# Patient Record
Sex: Female | Born: 1950 | Race: White | Hispanic: No | Marital: Married | Smoking: Former smoker
Health system: Southern US, Community
[De-identification: ages and names within clinical notes are randomized; demographics above are authoritative.]

## PROBLEM LIST (undated history)

## (undated) DIAGNOSIS — E876 Hypokalemia: Secondary | ICD-10-CM

## (undated) DIAGNOSIS — I890 Lymphedema, not elsewhere classified: Secondary | ICD-10-CM

## (undated) DIAGNOSIS — C50919 Malignant neoplasm of unspecified site of unspecified female breast: Secondary | ICD-10-CM

## (undated) HISTORY — DX: Lymphedema, not elsewhere classified: I89.0

## (undated) HISTORY — DX: Hypokalemia: E87.6

## (undated) HISTORY — DX: Malignant neoplasm of unspecified site of unspecified female breast: C50.919

## (undated) HISTORY — PX: BREAST BIOPSY: SHX20

---

## 2000-11-06 ENCOUNTER — Other Ambulatory Visit: Admission: RE | Admit: 2000-11-06 | Discharge: 2000-11-06 | Payer: Self-pay | Admitting: Obstetrics and Gynecology

## 2000-11-13 ENCOUNTER — Encounter: Payer: Self-pay | Admitting: Obstetrics and Gynecology

## 2000-11-13 ENCOUNTER — Encounter: Admission: RE | Admit: 2000-11-13 | Discharge: 2000-11-13 | Payer: Self-pay | Admitting: Obstetrics and Gynecology

## 2001-06-16 HISTORY — PX: BREAST LUMPECTOMY: SHX2

## 2001-09-15 ENCOUNTER — Encounter: Payer: Self-pay | Admitting: Obstetrics and Gynecology

## 2001-09-15 ENCOUNTER — Encounter: Admission: RE | Admit: 2001-09-15 | Discharge: 2001-09-15 | Payer: Self-pay | Admitting: Obstetrics and Gynecology

## 2001-10-18 ENCOUNTER — Other Ambulatory Visit: Admission: RE | Admit: 2001-10-18 | Discharge: 2001-10-18 | Payer: Self-pay | Admitting: Obstetrics and Gynecology

## 2001-11-04 ENCOUNTER — Encounter (INDEPENDENT_AMBULATORY_CARE_PROVIDER_SITE_OTHER): Payer: Self-pay | Admitting: *Deleted

## 2001-11-04 ENCOUNTER — Encounter: Payer: Self-pay | Admitting: Obstetrics and Gynecology

## 2001-11-04 ENCOUNTER — Encounter: Admission: RE | Admit: 2001-11-04 | Discharge: 2001-11-04 | Payer: Self-pay | Admitting: Obstetrics and Gynecology

## 2001-11-19 ENCOUNTER — Encounter: Payer: Self-pay | Admitting: General Surgery

## 2001-11-19 ENCOUNTER — Encounter: Admission: RE | Admit: 2001-11-19 | Discharge: 2001-11-19 | Payer: Self-pay | Admitting: General Surgery

## 2001-11-22 ENCOUNTER — Encounter: Admission: RE | Admit: 2001-11-22 | Discharge: 2001-11-22 | Payer: Self-pay | Admitting: General Surgery

## 2001-11-22 ENCOUNTER — Ambulatory Visit (HOSPITAL_BASED_OUTPATIENT_CLINIC_OR_DEPARTMENT_OTHER): Admission: RE | Admit: 2001-11-22 | Discharge: 2001-11-22 | Payer: Self-pay | Admitting: General Surgery

## 2001-11-22 ENCOUNTER — Encounter (INDEPENDENT_AMBULATORY_CARE_PROVIDER_SITE_OTHER): Payer: Self-pay | Admitting: *Deleted

## 2001-11-22 ENCOUNTER — Encounter: Payer: Self-pay | Admitting: General Surgery

## 2001-12-06 ENCOUNTER — Ambulatory Visit (HOSPITAL_BASED_OUTPATIENT_CLINIC_OR_DEPARTMENT_OTHER): Admission: RE | Admit: 2001-12-06 | Discharge: 2001-12-07 | Payer: Self-pay | Admitting: General Surgery

## 2001-12-06 ENCOUNTER — Encounter (INDEPENDENT_AMBULATORY_CARE_PROVIDER_SITE_OTHER): Payer: Self-pay | Admitting: *Deleted

## 2001-12-10 ENCOUNTER — Ambulatory Visit: Admission: RE | Admit: 2001-12-10 | Discharge: 2002-03-10 | Payer: Self-pay | Admitting: Radiation Oncology

## 2001-12-16 ENCOUNTER — Encounter: Admission: RE | Admit: 2001-12-16 | Discharge: 2001-12-16 | Payer: Self-pay | Admitting: Oncology

## 2001-12-16 ENCOUNTER — Encounter: Payer: Self-pay | Admitting: Oncology

## 2001-12-20 ENCOUNTER — Encounter: Payer: Self-pay | Admitting: Oncology

## 2001-12-20 ENCOUNTER — Ambulatory Visit (HOSPITAL_COMMUNITY): Admission: RE | Admit: 2001-12-20 | Discharge: 2001-12-20 | Payer: Self-pay | Admitting: Oncology

## 2002-02-07 ENCOUNTER — Other Ambulatory Visit: Admission: RE | Admit: 2002-02-07 | Discharge: 2002-02-07 | Payer: Self-pay | Admitting: Radiology

## 2002-04-21 ENCOUNTER — Encounter: Payer: Self-pay | Admitting: Oncology

## 2002-04-21 ENCOUNTER — Encounter: Admission: RE | Admit: 2002-04-21 | Discharge: 2002-04-21 | Payer: Self-pay | Admitting: Oncology

## 2002-04-25 ENCOUNTER — Ambulatory Visit: Admission: RE | Admit: 2002-04-25 | Discharge: 2002-05-03 | Payer: Self-pay | Admitting: Radiation Oncology

## 2002-05-02 ENCOUNTER — Encounter: Payer: Self-pay | Admitting: Oncology

## 2002-05-02 ENCOUNTER — Ambulatory Visit (HOSPITAL_COMMUNITY): Admission: RE | Admit: 2002-05-02 | Discharge: 2002-05-02 | Payer: Self-pay | Admitting: Oncology

## 2002-05-10 ENCOUNTER — Ambulatory Visit: Admission: RE | Admit: 2002-05-10 | Discharge: 2002-08-03 | Payer: Self-pay | Admitting: Radiation Oncology

## 2002-08-12 ENCOUNTER — Encounter: Payer: Self-pay | Admitting: Family Medicine

## 2002-08-12 ENCOUNTER — Encounter: Admission: RE | Admit: 2002-08-12 | Discharge: 2002-08-12 | Payer: Self-pay | Admitting: Family Medicine

## 2002-09-21 ENCOUNTER — Encounter: Admission: RE | Admit: 2002-09-21 | Discharge: 2002-09-21 | Payer: Self-pay | Admitting: General Surgery

## 2002-09-21 ENCOUNTER — Encounter: Payer: Self-pay | Admitting: General Surgery

## 2002-12-12 ENCOUNTER — Encounter: Payer: Self-pay | Admitting: Oncology

## 2002-12-12 ENCOUNTER — Encounter: Admission: RE | Admit: 2002-12-12 | Discharge: 2002-12-12 | Payer: Self-pay | Admitting: Oncology

## 2002-12-23 ENCOUNTER — Encounter: Payer: Self-pay | Admitting: Oncology

## 2002-12-23 ENCOUNTER — Ambulatory Visit (HOSPITAL_COMMUNITY): Admission: RE | Admit: 2002-12-23 | Discharge: 2002-12-23 | Payer: Self-pay | Admitting: Oncology

## 2003-01-24 ENCOUNTER — Other Ambulatory Visit: Admission: RE | Admit: 2003-01-24 | Discharge: 2003-01-24 | Payer: Self-pay | Admitting: *Deleted

## 2003-02-16 ENCOUNTER — Encounter: Payer: Self-pay | Admitting: Oncology

## 2003-02-16 ENCOUNTER — Encounter (HOSPITAL_COMMUNITY): Admission: RE | Admit: 2003-02-16 | Discharge: 2003-05-17 | Payer: Self-pay | Admitting: Oncology

## 2003-04-10 ENCOUNTER — Ambulatory Visit (HOSPITAL_COMMUNITY): Admission: RE | Admit: 2003-04-10 | Discharge: 2003-04-10 | Payer: Self-pay | Admitting: Gastroenterology

## 2003-06-17 HISTORY — PX: BREAST REDUCTION SURGERY: SHX8

## 2003-12-21 ENCOUNTER — Encounter: Admission: RE | Admit: 2003-12-21 | Discharge: 2003-12-21 | Payer: Self-pay | Admitting: Oncology

## 2004-03-12 ENCOUNTER — Encounter (INDEPENDENT_AMBULATORY_CARE_PROVIDER_SITE_OTHER): Payer: Self-pay | Admitting: Specialist

## 2004-03-12 ENCOUNTER — Ambulatory Visit (HOSPITAL_COMMUNITY): Admission: RE | Admit: 2004-03-12 | Discharge: 2004-03-12 | Payer: Self-pay | Admitting: Plastic Surgery

## 2004-03-12 ENCOUNTER — Ambulatory Visit (HOSPITAL_BASED_OUTPATIENT_CLINIC_OR_DEPARTMENT_OTHER): Admission: RE | Admit: 2004-03-12 | Discharge: 2004-03-12 | Payer: Self-pay | Admitting: Plastic Surgery

## 2004-06-12 ENCOUNTER — Encounter: Admission: RE | Admit: 2004-06-12 | Discharge: 2004-06-12 | Payer: Self-pay | Admitting: Oncology

## 2004-08-05 ENCOUNTER — Encounter: Admission: RE | Admit: 2004-08-05 | Discharge: 2004-08-05 | Payer: Self-pay | Admitting: Family Medicine

## 2004-08-30 ENCOUNTER — Ambulatory Visit: Payer: Self-pay | Admitting: Oncology

## 2004-09-23 ENCOUNTER — Encounter: Admission: RE | Admit: 2004-09-23 | Discharge: 2004-09-23 | Payer: Self-pay | Admitting: Oncology

## 2005-02-26 ENCOUNTER — Encounter: Admission: RE | Admit: 2005-02-26 | Discharge: 2005-02-26 | Payer: Self-pay | Admitting: Oncology

## 2005-03-04 ENCOUNTER — Ambulatory Visit: Payer: Self-pay | Admitting: Oncology

## 2005-06-23 ENCOUNTER — Encounter: Admission: RE | Admit: 2005-06-23 | Discharge: 2005-06-23 | Payer: Self-pay | Admitting: General Surgery

## 2005-06-25 ENCOUNTER — Encounter: Admission: RE | Admit: 2005-06-25 | Discharge: 2005-06-25 | Payer: Self-pay | Admitting: Oncology

## 2005-08-27 ENCOUNTER — Ambulatory Visit: Payer: Self-pay | Admitting: Oncology

## 2006-01-02 ENCOUNTER — Ambulatory Visit: Payer: Self-pay | Admitting: Oncology

## 2006-01-07 LAB — COMPREHENSIVE METABOLIC PANEL
ALT: 26 U/L (ref 0–40)
CO2: 28 mEq/L (ref 19–32)
Calcium: 9.4 mg/dL (ref 8.4–10.5)
Chloride: 102 mEq/L (ref 96–112)
Creatinine, Ser: 0.73 mg/dL (ref 0.40–1.20)
Glucose, Bld: 152 mg/dL — ABNORMAL HIGH (ref 70–99)
Total Protein: 6.7 g/dL (ref 6.0–8.3)

## 2006-01-07 LAB — CBC WITH DIFFERENTIAL/PLATELET
BASO%: 0.3 % (ref 0.0–2.0)
Basophils Absolute: 0 10*3/uL (ref 0.0–0.1)
Eosinophils Absolute: 0.2 10*3/uL (ref 0.0–0.5)
HCT: 43 % (ref 34.8–46.6)
HGB: 14.5 g/dL (ref 11.6–15.9)
MCHC: 33.7 g/dL (ref 32.0–36.0)
MONO#: 0.4 10*3/uL (ref 0.1–0.9)
NEUT#: 4.5 10*3/uL (ref 1.5–6.5)
NEUT%: 67.2 % (ref 39.6–76.8)
WBC: 6.7 10*3/uL (ref 3.9–10.0)
lymph#: 1.6 10*3/uL (ref 0.9–3.3)

## 2006-01-07 LAB — CANCER ANTIGEN 27.29: CA 27.29: 21 U/mL (ref 0–39)

## 2006-05-03 ENCOUNTER — Ambulatory Visit: Payer: Self-pay | Admitting: Oncology

## 2006-05-06 LAB — CBC WITH DIFFERENTIAL/PLATELET
BASO%: 1.3 % (ref 0.0–2.0)
EOS%: 2.6 % (ref 0.0–7.0)
HCT: 43.3 % (ref 34.8–46.6)
MCH: 28.5 pg (ref 26.0–34.0)
MCHC: 34.3 g/dL (ref 32.0–36.0)
MONO#: 0.4 10*3/uL (ref 0.1–0.9)
NEUT%: 67.3 % (ref 39.6–76.8)
RBC: 5.21 10*6/uL (ref 3.70–5.32)
RDW: 11.3 % (ref 11.3–14.5)
WBC: 6.9 10*3/uL (ref 3.9–10.0)
lymph#: 1.6 10*3/uL (ref 0.9–3.3)

## 2006-05-06 LAB — COMPREHENSIVE METABOLIC PANEL
ALT: 23 U/L (ref 0–35)
AST: 18 U/L (ref 0–37)
Albumin: 4.8 g/dL (ref 3.5–5.2)
Alkaline Phosphatase: 64 U/L (ref 39–117)
BUN: 11 mg/dL (ref 6–23)
Chloride: 96 mEq/L (ref 96–112)
Creatinine, Ser: 0.63 mg/dL (ref 0.40–1.20)
Potassium: 2.8 mEq/L — ABNORMAL LOW (ref 3.5–5.3)

## 2006-05-06 LAB — CANCER ANTIGEN 27.29: CA 27.29: 17 U/mL (ref 0–39)

## 2006-05-12 ENCOUNTER — Ambulatory Visit (HOSPITAL_COMMUNITY): Admission: RE | Admit: 2006-05-12 | Discharge: 2006-05-12 | Payer: Self-pay | Admitting: Oncology

## 2006-05-15 LAB — COMPREHENSIVE METABOLIC PANEL
Albumin: 4 g/dL (ref 3.5–5.2)
BUN: 12 mg/dL (ref 6–23)
Calcium: 9.9 mg/dL (ref 8.4–10.5)
Chloride: 103 mEq/L (ref 96–112)
Creatinine, Ser: 0.6 mg/dL (ref 0.40–1.20)
Glucose, Bld: 102 mg/dL — ABNORMAL HIGH (ref 70–99)
Potassium: 3.5 mEq/L (ref 3.5–5.3)

## 2006-06-16 HISTORY — PX: FOOT FRACTURE SURGERY: SHX645

## 2006-06-24 ENCOUNTER — Encounter: Admission: RE | Admit: 2006-06-24 | Discharge: 2006-06-24 | Payer: Self-pay | Admitting: Oncology

## 2006-06-26 ENCOUNTER — Other Ambulatory Visit: Admission: RE | Admit: 2006-06-26 | Discharge: 2006-06-26 | Payer: Self-pay | Admitting: Family Medicine

## 2006-11-03 ENCOUNTER — Ambulatory Visit: Payer: Self-pay | Admitting: Oncology

## 2006-11-05 ENCOUNTER — Ambulatory Visit (HOSPITAL_COMMUNITY): Admission: RE | Admit: 2006-11-05 | Discharge: 2006-11-05 | Payer: Self-pay | Admitting: Oncology

## 2006-11-05 LAB — COMPREHENSIVE METABOLIC PANEL
ALT: 20 U/L (ref 0–35)
AST: 20 U/L (ref 0–37)
Albumin: 4.6 g/dL (ref 3.5–5.2)
CO2: 29 mEq/L (ref 19–32)
Calcium: 9.9 mg/dL (ref 8.4–10.5)
Chloride: 102 mEq/L (ref 96–112)
Creatinine, Ser: 0.66 mg/dL (ref 0.40–1.20)
Potassium: 3.4 mEq/L — ABNORMAL LOW (ref 3.5–5.3)
Sodium: 143 mEq/L (ref 135–145)
Total Protein: 6.8 g/dL (ref 6.0–8.3)

## 2006-11-05 LAB — CBC WITH DIFFERENTIAL/PLATELET
EOS%: 3.4 % (ref 0.0–7.0)
MCH: 28.8 pg (ref 26.0–34.0)
MCV: 82 fL (ref 81.0–101.0)
MONO%: 7.4 % (ref 0.0–13.0)
NEUT#: 4 10*3/uL (ref 1.5–6.5)
RBC: 5.03 10*6/uL (ref 3.70–5.32)
RDW: 13 % (ref 11.3–14.5)

## 2006-11-05 LAB — LACTATE DEHYDROGENASE: LDH: 128 U/L (ref 94–250)

## 2007-05-11 ENCOUNTER — Ambulatory Visit (HOSPITAL_BASED_OUTPATIENT_CLINIC_OR_DEPARTMENT_OTHER): Admission: RE | Admit: 2007-05-11 | Discharge: 2007-05-11 | Payer: Self-pay | Admitting: Orthopedic Surgery

## 2007-05-18 ENCOUNTER — Ambulatory Visit: Payer: Self-pay | Admitting: Oncology

## 2007-05-21 LAB — COMPREHENSIVE METABOLIC PANEL
ALT: 17 U/L (ref 0–35)
AST: 14 U/L (ref 0–37)
Albumin: 4.6 g/dL (ref 3.5–5.2)
Alkaline Phosphatase: 58 U/L (ref 39–117)
BUN: 13 mg/dL (ref 6–23)
CO2: 27 mEq/L (ref 19–32)
Calcium: 9.9 mg/dL (ref 8.4–10.5)
Chloride: 105 mEq/L (ref 96–112)
Creatinine, Ser: 0.64 mg/dL (ref 0.40–1.20)
Glucose, Bld: 91 mg/dL (ref 70–99)
Potassium: 3.9 mEq/L (ref 3.5–5.3)
Sodium: 142 mEq/L (ref 135–145)
Total Bilirubin: 0.5 mg/dL (ref 0.3–1.2)
Total Protein: 7.1 g/dL (ref 6.0–8.3)

## 2007-05-21 LAB — CBC WITH DIFFERENTIAL/PLATELET
EOS%: 2.2 % (ref 0.0–7.0)
Eosinophils Absolute: 0.2 10*3/uL (ref 0.0–0.5)
LYMPH%: 27.2 % (ref 14.0–48.0)
MCH: 28.7 pg (ref 26.0–34.0)
MCHC: 34.6 g/dL (ref 32.0–36.0)
MCV: 82.9 fL (ref 81.0–101.0)
MONO%: 7.4 % (ref 0.0–13.0)
NEUT#: 4.3 10*3/uL (ref 1.5–6.5)
Platelets: 213 10*3/uL (ref 145–400)
RBC: 5.03 10*6/uL (ref 3.70–5.32)
RDW: 12.8 % (ref 11.3–14.5)

## 2007-05-21 LAB — CANCER ANTIGEN 27.29: CA 27.29: 20 U/mL (ref 0–39)

## 2007-05-21 LAB — LACTATE DEHYDROGENASE: LDH: 140 U/L (ref 94–250)

## 2007-06-28 ENCOUNTER — Encounter: Admission: RE | Admit: 2007-06-28 | Discharge: 2007-06-28 | Payer: Self-pay | Admitting: Oncology

## 2007-07-12 ENCOUNTER — Encounter: Admission: RE | Admit: 2007-07-12 | Discharge: 2007-07-12 | Payer: Self-pay | Admitting: Oncology

## 2007-08-12 ENCOUNTER — Ambulatory Visit: Payer: Self-pay | Admitting: Oncology

## 2007-08-16 LAB — CBC WITH DIFFERENTIAL/PLATELET
Basophils Absolute: 0.1 10*3/uL (ref 0.0–0.1)
EOS%: 1.5 % (ref 0.0–7.0)
HGB: 14.2 g/dL (ref 11.6–15.9)
MCH: 28.2 pg (ref 26.0–34.0)
NEUT#: 4.2 10*3/uL (ref 1.5–6.5)
RDW: 12.8 % (ref 11.3–14.5)
lymph#: 1.8 10*3/uL (ref 0.9–3.3)

## 2007-08-16 LAB — COMPREHENSIVE METABOLIC PANEL
ALT: 19 U/L (ref 0–35)
AST: 16 U/L (ref 0–37)
Albumin: 4.4 g/dL (ref 3.5–5.2)
BUN: 11 mg/dL (ref 6–23)
Calcium: 9.6 mg/dL (ref 8.4–10.5)
Chloride: 101 mEq/L (ref 96–112)
Potassium: 3.2 mEq/L — ABNORMAL LOW (ref 3.5–5.3)
Total Protein: 6.9 g/dL (ref 6.0–8.3)

## 2007-08-16 LAB — CANCER ANTIGEN 27.29: CA 27.29: 23 U/mL (ref 0–39)

## 2008-06-29 ENCOUNTER — Encounter: Admission: RE | Admit: 2008-06-29 | Discharge: 2008-06-29 | Payer: Self-pay | Admitting: Oncology

## 2008-08-16 ENCOUNTER — Ambulatory Visit: Payer: Self-pay | Admitting: Oncology

## 2008-08-18 LAB — CBC WITH DIFFERENTIAL/PLATELET
BASO%: 0.4 % (ref 0.0–2.0)
MCHC: 33.6 g/dL (ref 31.5–36.0)
MONO#: 0.5 10*3/uL (ref 0.1–0.9)
NEUT#: 2.9 10*3/uL (ref 1.5–6.5)
RBC: 5.24 10*6/uL (ref 3.70–5.45)
WBC: 4.9 10*3/uL (ref 3.9–10.3)
lymph#: 1.4 10*3/uL (ref 0.9–3.3)

## 2008-08-21 LAB — COMPREHENSIVE METABOLIC PANEL
ALT: 17 U/L (ref 0–35)
Albumin: 4.3 g/dL (ref 3.5–5.2)
CO2: 31 mEq/L (ref 19–32)
Calcium: 9.8 mg/dL (ref 8.4–10.5)
Chloride: 97 mEq/L (ref 96–112)
Potassium: 3.6 mEq/L (ref 3.5–5.3)
Sodium: 138 mEq/L (ref 135–145)
Total Protein: 6.9 g/dL (ref 6.0–8.3)

## 2008-08-21 LAB — LACTATE DEHYDROGENASE: LDH: 122 U/L (ref 94–250)

## 2008-08-21 LAB — LIPID PANEL
LDL Cholesterol: 139 mg/dL — ABNORMAL HIGH (ref 0–99)
Triglycerides: 106 mg/dL (ref <150)
VLDL: 21 mg/dL (ref 0–40)

## 2009-06-16 HISTORY — PX: HAND SURGERY: SHX662

## 2009-07-02 ENCOUNTER — Encounter: Admission: RE | Admit: 2009-07-02 | Discharge: 2009-07-02 | Payer: Self-pay | Admitting: Oncology

## 2009-07-12 ENCOUNTER — Encounter: Admission: RE | Admit: 2009-07-12 | Discharge: 2009-07-12 | Payer: Self-pay | Admitting: Oncology

## 2009-07-13 ENCOUNTER — Encounter: Admission: RE | Admit: 2009-07-13 | Discharge: 2009-07-13 | Payer: Self-pay | Admitting: Oncology

## 2009-07-19 ENCOUNTER — Encounter: Admission: RE | Admit: 2009-07-19 | Discharge: 2009-07-19 | Payer: Self-pay | Admitting: Oncology

## 2009-08-27 ENCOUNTER — Encounter: Admission: RE | Admit: 2009-08-27 | Discharge: 2009-08-27 | Payer: Self-pay | Admitting: Surgery

## 2009-08-27 ENCOUNTER — Ambulatory Visit (HOSPITAL_BASED_OUTPATIENT_CLINIC_OR_DEPARTMENT_OTHER): Admission: RE | Admit: 2009-08-27 | Discharge: 2009-08-27 | Payer: Self-pay | Admitting: Surgery

## 2009-09-07 ENCOUNTER — Ambulatory Visit (HOSPITAL_BASED_OUTPATIENT_CLINIC_OR_DEPARTMENT_OTHER): Payer: Managed Care, Other (non HMO) | Admitting: Oncology

## 2009-09-11 LAB — LIPID PANEL
Cholesterol: 199 mg/dL (ref 0–200)
HDL: 43 mg/dL (ref >39)
Total CHOL/HDL Ratio: 4.6 Ratio
Triglycerides: 179 mg/dL — ABNORMAL HIGH (ref <150)

## 2009-09-11 LAB — COMPREHENSIVE METABOLIC PANEL
ALT: 17 U/L (ref 0–35)
BUN: 11 mg/dL (ref 6–23)
CO2: 26 mEq/L (ref 19–32)
Calcium: 9.5 mg/dL (ref 8.4–10.5)
Chloride: 99 mEq/L (ref 96–112)
Creatinine, Ser: 0.69 mg/dL (ref 0.40–1.20)
Total Bilirubin: 0.4 mg/dL (ref 0.3–1.2)

## 2009-09-11 LAB — CBC WITH DIFFERENTIAL/PLATELET
BASO%: 0.5 % (ref 0.0–2.0)
Basophils Absolute: 0 10*3/uL (ref 0.0–0.1)
HCT: 40.3 % (ref 34.8–46.6)
HGB: 13.7 g/dL (ref 11.6–15.9)
LYMPH%: 25.1 % (ref 14.0–49.7)
MCH: 28.7 pg (ref 25.1–34.0)
MCHC: 34 g/dL (ref 31.5–36.0)
MONO#: 0.5 10*3/uL (ref 0.1–0.9)
NEUT%: 63.7 % (ref 38.4–76.8)
Platelets: 222 10*3/uL (ref 145–400)
WBC: 7.5 10*3/uL (ref 3.9–10.3)
lymph#: 1.9 10*3/uL (ref 0.9–3.3)

## 2009-09-11 LAB — CANCER ANTIGEN 27.29: CA 27.29: 18 U/mL (ref 0–39)

## 2009-09-19 LAB — LIPID PANEL
HDL: 51 mg/dL (ref >39)
LDL Cholesterol: 131 mg/dL — ABNORMAL HIGH (ref 0–99)
Triglycerides: 135 mg/dL (ref <150)

## 2010-07-04 ENCOUNTER — Encounter
Admission: RE | Admit: 2010-07-04 | Discharge: 2010-07-04 | Payer: Self-pay | Source: Home / Self Care | Attending: Oncology | Admitting: Oncology

## 2010-07-07 ENCOUNTER — Encounter: Payer: Self-pay | Admitting: Oncology

## 2010-07-08 ENCOUNTER — Encounter: Payer: Self-pay | Admitting: Oncology

## 2010-09-06 LAB — BASIC METABOLIC PANEL
BUN: 12 mg/dL (ref 6–23)
CO2: 30 mEq/L (ref 19–32)
Chloride: 99 mEq/L (ref 96–112)
Creatinine, Ser: 0.66 mg/dL (ref 0.4–1.2)
Glucose, Bld: 101 mg/dL — ABNORMAL HIGH (ref 70–99)
Potassium: 3.5 mEq/L (ref 3.5–5.1)

## 2010-09-06 LAB — POCT HEMOGLOBIN-HEMACUE: Hemoglobin: 15.2 g/dL — ABNORMAL HIGH (ref 12.0–15.0)

## 2010-09-20 ENCOUNTER — Encounter: Payer: Managed Care, Other (non HMO) | Admitting: Oncology

## 2010-09-20 DIAGNOSIS — C50519 Malignant neoplasm of lower-outer quadrant of unspecified female breast: Secondary | ICD-10-CM

## 2010-09-20 LAB — CBC WITH DIFFERENTIAL/PLATELET
Basophils Absolute: 0 10*3/uL (ref 0.0–0.1)
EOS%: 1.7 % (ref 0.0–7.0)
HCT: 41.3 % (ref 34.8–46.6)
HGB: 14 g/dL (ref 11.6–15.9)
LYMPH%: 28.4 % (ref 14.0–49.7)
MCH: 28.4 pg (ref 25.1–34.0)
MCHC: 33.9 g/dL (ref 31.5–36.0)
MCV: 83.8 fL (ref 79.5–101.0)
NEUT%: 62.5 % (ref 38.4–76.8)
Platelets: 172 10*3/uL (ref 145–400)
lymph#: 1.9 10*3/uL (ref 0.9–3.3)

## 2010-09-20 LAB — COMPREHENSIVE METABOLIC PANEL
AST: 13 U/L (ref 0–37)
BUN: 19 mg/dL (ref 6–23)
Calcium: 9.9 mg/dL (ref 8.4–10.5)
Chloride: 100 mEq/L (ref 96–112)
Creatinine, Ser: 0.81 mg/dL (ref 0.40–1.20)
Total Bilirubin: 0.4 mg/dL (ref 0.3–1.2)

## 2010-09-20 LAB — LACTATE DEHYDROGENASE: LDH: 125 U/L (ref 94–250)

## 2010-09-20 LAB — VITAMIN D 25 HYDROXY (VIT D DEFICIENCY, FRACTURES): Vit D, 25-Hydroxy: 107 ng/mL — ABNORMAL HIGH (ref 30–89)

## 2010-10-03 ENCOUNTER — Encounter (HOSPITAL_BASED_OUTPATIENT_CLINIC_OR_DEPARTMENT_OTHER): Payer: Managed Care, Other (non HMO) | Admitting: Oncology

## 2010-10-07 ENCOUNTER — Other Ambulatory Visit (HOSPITAL_COMMUNITY): Payer: Self-pay | Admitting: Ophthalmology

## 2010-10-07 ENCOUNTER — Other Ambulatory Visit: Payer: Self-pay | Admitting: Oncology

## 2010-10-07 DIAGNOSIS — C50919 Malignant neoplasm of unspecified site of unspecified female breast: Secondary | ICD-10-CM

## 2010-10-07 DIAGNOSIS — Z1231 Encounter for screening mammogram for malignant neoplasm of breast: Secondary | ICD-10-CM

## 2010-10-07 DIAGNOSIS — Z780 Asymptomatic menopausal state: Secondary | ICD-10-CM

## 2010-10-29 NOTE — Op Note (Signed)
NAMEJON, KASPAREK NO.:  [id number]   MEDICAL RECORD NO.:  [id number]          PATIENT TYPE:  AMB   LOCATION:  DSC                          FACILITY:  [facility]   PHYSICIAN:  Leonides Grills, M.D.     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   OPERATION:  1. Open reduction internal fixation left second toe PIP joint.  2. Left second toe proximal phalanx head resection.   ANESTHESIA:  Is general.   SURGEON:  Leonides Grills, M.D.   ASSISTANT:  Evlyn Kanner, P.A.-C.   ESTIMATED BLOOD LOSS:  Minimal.   TOURNIQUET TIME:  Approximately a half hour.   COMPLICATIONS:  None.   DISPOSITION:  Stable to PR.   INDICATIONS:  This is a 60 year old female who sustained the above  injury.  She was consented for the above procedure.  All risks which  include infection, nerve or vessel injury, toe deformity, redislocation,  hardware irritation, hardware failure and loss of motion through the PIP  joint and a stiff toe were all explained.  Questions were encouraged and  answered.   OPERATIVE NOTE:  The patient was taken to the operating room and placed  in supine position.  After adequate general anesthesia was administered  as well as Ancef 1 gram IV piggyback, left lower extremity was then  prepped and draped sterile manner.  With a proximally thigh tourniquet,  limb was gravity exsanguinated, and tourniquet was inflated to 290 mmHg.  A longitudinal incision over the dorsal aspect of the left second toe  was then made.  Dissection was carried down through skin.  Hemostasis  was obtained.  Extensor tendon was identified, mobilized, and retracted  of harm's way.  The PIP joint was then entered.   There was a large  amount of callus and scar tissue in this area.  This was carefully  dissected out.  The proximal phalanx head was then identified, and due  to the fact this was so contracted and the head was difficult to place  back in due to soft tissue tension, we performed a proximal phalanx head  resection.  This allowed the joint to reduce beautifully.  We then  placed a 0.0045 K-wire antegrade through the middle and distal phalanx,  reduced PIP joint, fired this retrograde into the proximal phalanx.  We  did not cross the MTP joint.  We then obtained stress x-rays in the AP  and lateral planes which showed no gross motion, fixation, proposition  and excellent alignment as well in the AP lateral planes.  Tourniquet  was deflated.  Hemostasis  was obtained.  Toe pinked up nicely.  Skin relieving incisions were made  on either side of the K-wire.  K-wire was bent, cut and capped.  Skin  was closed with 4-0 nylon stitch.  Sterile dressing was applied.  Hard  sole shoe was applied.  Patient was stable to PR.      Leonides Grills, M.D.  Electronically Signed     PB/MEDQ  D:  05/11/2007  T:  05/11/2007  Job:  [id number]

## 2010-10-30 ENCOUNTER — Inpatient Hospital Stay: Admission: RE | Admit: 2010-10-30 | Payer: Managed Care, Other (non HMO) | Source: Ambulatory Visit

## 2011-03-25 LAB — BASIC METABOLIC PANEL
CO2: 35 — ABNORMAL HIGH
Chloride: 94 — ABNORMAL LOW
GFR calc Af Amer: >60
Potassium: 3 — ABNORMAL LOW
Sodium: 135

## 2011-03-25 LAB — POCT HEMOGLOBIN-HEMACUE
Hemoglobin: 14.6
Operator id: 128471

## 2011-03-26 ENCOUNTER — Encounter (HOSPITAL_BASED_OUTPATIENT_CLINIC_OR_DEPARTMENT_OTHER)
Admission: RE | Admit: 2011-03-26 | Discharge: 2011-03-26 | Disposition: A | Discharge status: Home / Self Care | Payer: Managed Care, Other (non HMO) | Source: Ambulatory Visit | Attending: Orthopedic Surgery | Admitting: Orthopedic Surgery

## 2011-03-26 LAB — BASIC METABOLIC PANEL
CO2: 30 mEq/L (ref 19–32)
Chloride: 101 mEq/L (ref 96–112)
GFR calc Af Amer: >90 mL/min (ref >90)
Potassium: 3.5 mEq/L (ref 3.5–5.1)
Sodium: 140 mEq/L (ref 135–145)

## 2011-03-28 ENCOUNTER — Other Ambulatory Visit: Payer: Self-pay | Admitting: Orthopedic Surgery

## 2011-03-28 ENCOUNTER — Ambulatory Visit (HOSPITAL_BASED_OUTPATIENT_CLINIC_OR_DEPARTMENT_OTHER)
Admission: RE | Admit: 2011-03-28 | Discharge: 2011-03-28 | Disposition: A | Discharge status: Home / Self Care | Payer: Managed Care, Other (non HMO) | Source: Ambulatory Visit | Attending: Orthopedic Surgery | Admitting: Orthopedic Surgery

## 2011-03-28 DIAGNOSIS — R229 Localized swelling, mass and lump, unspecified: Secondary | ICD-10-CM | POA: Insufficient documentation

## 2011-03-28 DIAGNOSIS — F17200 Nicotine dependence, unspecified, uncomplicated: Secondary | ICD-10-CM | POA: Insufficient documentation

## 2011-03-28 DIAGNOSIS — Z01810 Encounter for preprocedural cardiovascular examination: Secondary | ICD-10-CM | POA: Insufficient documentation

## 2011-03-28 DIAGNOSIS — Z01812 Encounter for preprocedural laboratory examination: Secondary | ICD-10-CM | POA: Insufficient documentation

## 2011-03-28 DIAGNOSIS — I10 Essential (primary) hypertension: Secondary | ICD-10-CM | POA: Insufficient documentation

## 2011-03-28 LAB — POCT HEMOGLOBIN-HEMACUE: Hemoglobin: 14.6 g/dL (ref 12.0–15.0)

## 2011-04-02 NOTE — Op Note (Signed)
  Suzanne Carney, Suzanne Carney NO.:  [id number]  MEDICAL RECORD NO.:  [id number]  LOCATION:                                 FACILITY:  PHYSICIAN:  Katy Fitch. Savahanna Almendariz, M.D. DATE OF BIRTH:  12-Feb-1951  DATE OF PROCEDURE:  03/28/2011 DATE OF DISCHARGE:                              OPERATIVE REPORT   PREOPERATIVE DIAGNOSIS:  Enlarging firm mass, palmar aspect of right ring finger middle phalangeal segment.  POSTOPERATIVE DIAGNOSIS:  Firm, white-gray nodular mass, rule out granuloma annulare, rule out giant cell tumor.  Final diagnosis pending histopathologic evaluation.  OPERATION:  Resection of flexor sheath mass, right ring finger middle phalangeal segment.  OPERATING SURGEON:  Katy Fitch. Alline Pio, MD  ASSISTANT:  Marveen Reeks Dasnoit, PA-C  ANESTHESIA:  General by LMA supplemented by a postoperative 2% lidocaine digital block for postoperative comfort.  SUPERVISING ANESTHESIOLOGIST:  Janetta Hora. Gelene Mink, MD  INDICATIONS:  Suzanne Carney is a 60 year old woman referred through the courtesy of Dr. Foy Guadalajara of [facility] with a mass on the volar aspect of her right ring finger.  Clinical examination revealed a firm mass consistent with giant cell tumor, granuloma annulare, or other nerve element tumor.  This had been enlarging.  She requested excisional biopsy.  Preoperatively, questions were invited and answered in detail.  PROCEDURE:  [name] was brought to room 1 of the [facility] and placed in supine position on the operating table.  Due to IV access in the right antecubital fossa, a proximal forearm tourniquet was placed on the right.  The right arm was then prepped with Betadine soap and solution, and sterilely draped.  Following a routine surgical time- out, the right arm was exsanguinated with an Esmarch bandage and the proximal forearm tourniquet inflated to 220 mmHg.  Procedure commenced with planning of a Brunner zigzag incision  directly over the mass.  Subcutaneous tissues were carefully divided revealing a whitish-gray, firm, nodular mass that was adherent to the dermis, the flexor sheath, and surrounding the neurovascular bundles.  The neurovascular bundles were meticulously dissected, taking care to gently dissect the mass off the proper digital nerves both radial and ulnar. This was adherent to the A4 pulley and the C3 pulley.  After careful marginal resection, the mass was placed in formalin for pathologic evaluation.  This was a marginal resection for biopsy and if this lesion is benign should have a high probability of improvement.  The final diagnosis is uncertain pending pathologic evaluation.  The wound was inspected for bleeding points followed by repair of the skin with interrupted suture of 5-0 nylon.  For aftercare, [name] was placed in a compressive dressing with Xeroflo, sterile gauze, and Coban.  We will see her back in followup in a week for dressing change, suture removal, and discussion of her pathologic results.     Katy Fitch Sherry Rogus, M.D.     RVS/MEDQ  D:  03/28/2011  T:  03/28/2011  Job:  [id number]  cc:   Molly Maduro L. Foy Guadalajara, M.D.  Electronically Signed by Josephine Igo M.D. on 04/02/2011 07:55:58 AM

## 2011-04-10 ENCOUNTER — Encounter: Payer: Self-pay | Admitting: *Deleted

## 2011-04-10 DIAGNOSIS — C50912 Malignant neoplasm of unspecified site of left female breast: Secondary | ICD-10-CM | POA: Insufficient documentation

## 2011-04-10 DIAGNOSIS — C50919 Malignant neoplasm of unspecified site of unspecified female breast: Secondary | ICD-10-CM

## 2011-04-23 ENCOUNTER — Other Ambulatory Visit: Payer: Self-pay | Admitting: Oncology

## 2011-04-23 DIAGNOSIS — Z853 Personal history of malignant neoplasm of breast: Secondary | ICD-10-CM

## 2011-04-23 DIAGNOSIS — Z9889 Other specified postprocedural states: Secondary | ICD-10-CM

## 2011-05-26 ENCOUNTER — Telehealth: Payer: Self-pay | Admitting: *Deleted

## 2011-05-26 ENCOUNTER — Ambulatory Visit: Payer: Managed Care, Other (non HMO) | Admitting: Infectious Diseases

## 2011-05-26 NOTE — Telephone Encounter (Signed)
gave patient appointment for 09-24-2011 starting at 10:00am gave patient appointment for mammogram and bone density on 07-07-2011 at 11:00am

## 2011-05-27 ENCOUNTER — Encounter: Payer: Managed Care, Other (non HMO) | Admitting: Infectious Diseases

## 2011-05-27 ENCOUNTER — Encounter: Payer: Self-pay | Admitting: Infectious Diseases

## 2011-07-07 ENCOUNTER — Ambulatory Visit
Admission: RE | Admit: 2011-07-07 | Discharge: 2011-07-07 | Disposition: A | Discharge status: Home / Self Care | Payer: Managed Care, Other (non HMO) | Source: Ambulatory Visit | Attending: Oncology | Admitting: Oncology

## 2011-07-07 DIAGNOSIS — Z780 Asymptomatic menopausal state: Secondary | ICD-10-CM

## 2011-07-07 DIAGNOSIS — Z1231 Encounter for screening mammogram for malignant neoplasm of breast: Secondary | ICD-10-CM

## 2011-08-13 ENCOUNTER — Encounter: Payer: Self-pay | Admitting: Oncology

## 2011-08-13 NOTE — Progress Notes (Signed)
08/13/2011  Received a denial letter from [facility] for a Breast MRI scheduled for 08/14/2011 @ [facility].  Do you want to do a peer-to-peer or for me to call Wilkie Aye and have this MRI rescheduled?  [phone] option 4  [employer]  [id number]

## 2011-08-15 ENCOUNTER — Other Ambulatory Visit: Payer: Managed Care, Other (non HMO)

## 2011-09-17 ENCOUNTER — Telehealth: Payer: Self-pay | Admitting: *Deleted

## 2011-09-17 ENCOUNTER — Other Ambulatory Visit: Payer: Self-pay | Admitting: *Deleted

## 2011-09-17 NOTE — Telephone Encounter (Signed)
left voice message to inform the patient of the new date and time on 09-22-2011 starting at 10:00am

## 2011-09-18 ENCOUNTER — Other Ambulatory Visit: Payer: Self-pay | Admitting: Oncology

## 2011-09-18 DIAGNOSIS — C50919 Malignant neoplasm of unspecified site of unspecified female breast: Secondary | ICD-10-CM

## 2011-09-22 ENCOUNTER — Telehealth: Payer: Self-pay | Admitting: *Deleted

## 2011-09-22 ENCOUNTER — Ambulatory Visit (HOSPITAL_BASED_OUTPATIENT_CLINIC_OR_DEPARTMENT_OTHER): Payer: Managed Care, Other (non HMO) | Admitting: Oncology

## 2011-09-22 ENCOUNTER — Other Ambulatory Visit (HOSPITAL_BASED_OUTPATIENT_CLINIC_OR_DEPARTMENT_OTHER): Payer: Managed Care, Other (non HMO) | Admitting: Lab

## 2011-09-22 ENCOUNTER — Encounter: Payer: Self-pay | Admitting: *Deleted

## 2011-09-22 VITALS — BP 116/78 | HR 86 | Temp 98.3°F | Ht <= 58 in | Wt 137.4 lb

## 2011-09-22 DIAGNOSIS — Z853 Personal history of malignant neoplasm of breast: Secondary | ICD-10-CM

## 2011-09-22 DIAGNOSIS — C50919 Malignant neoplasm of unspecified site of unspecified female breast: Secondary | ICD-10-CM

## 2011-09-22 LAB — CBC WITH DIFFERENTIAL/PLATELET
BASO%: 0.9 % (ref 0.0–2.0)
EOS%: 3.1 % (ref 0.0–7.0)
HCT: 38.9 % (ref 34.8–46.6)
MCH: 28.1 pg (ref 25.1–34.0)
MCHC: 33.8 g/dL (ref 31.5–36.0)
MONO#: 0.4 10*3/uL (ref 0.1–0.9)
NEUT%: 55.8 % (ref 38.4–76.8)
RBC: 4.67 10*6/uL (ref 3.70–5.45)
RDW: 12.8 % (ref 11.2–14.5)
WBC: 4.4 10*3/uL (ref 3.9–10.3)
lymph#: 1.4 10*3/uL (ref 0.9–3.3)
nRBC: 0 % (ref 0–0)

## 2011-09-22 LAB — COMPREHENSIVE METABOLIC PANEL
ALT: 13 U/L (ref 0–35)
AST: 15 U/L (ref 0–37)
Albumin: 4.4 g/dL (ref 3.5–5.2)
Alkaline Phosphatase: 61 U/L (ref 39–117)
Calcium: 9.5 mg/dL (ref 8.4–10.5)
Chloride: 104 mEq/L (ref 96–112)
Potassium: 3.7 mEq/L (ref 3.5–5.3)
Sodium: 139 mEq/L (ref 135–145)
Total Protein: 6.3 g/dL (ref 6.0–8.3)

## 2011-09-22 LAB — LIPID PANEL
Cholesterol: 233 mg/dL — ABNORMAL HIGH (ref 0–200)
LDL Cholesterol: 153 mg/dL — ABNORMAL HIGH (ref 0–99)
Total CHOL/HDL Ratio: 3.8 Ratio
VLDL: 19 mg/dL (ref 0–40)

## 2011-09-22 MED ORDER — LETROZOLE/PLACEBO 2.5MG TABS #110 NSABP B-42: 2.5000 mg | ORAL_TABLET | Freq: Every day | ORAL | Status: DC

## 2011-09-22 NOTE — Telephone Encounter (Signed)
mammogram at the breast center made patient appointment for 09-21-2012 printed out calendar and gave to the patient

## 2011-09-22 NOTE — Progress Notes (Signed)
09-22-11 @ 11:08am, NSABP B-42, Month 48:  Ms. Gironda into the [facility] to see Dr. Donnie Coffin for a yearly assessment for follow up with her breast cancer.  She returned bottles 15 and 16 of letrozole/placebo.  Bottle 15 was empty and bottle 16 has 62 tablets remaining.   She reported rarely missing doses of study drug.  Per pill count she missed about 7 doses of 165 potential doses (4%).  She denies any bone fractures, arterial clots, new bisphosphates or lipid lowering medications.  She agreed to continue to take the study medication once a day by mouth and the pharmacy dispensed bottles 17 and 18 today.

## 2011-09-22 NOTE — Progress Notes (Signed)
Hematology and Oncology Follow Up Visit  Suzanne Carney [id number] May 05, 1951 61 y.o. 09/22/2011 11:51 AM PCP  Principle Diagnosis: T2N1 breast cancer s/p lumpectomy , TAC chemotherapy, xrt  And 5 y of AI hterapy, now enrolled in B26 , 41 y of study.  Interim History:  There have been no intercurrent illness, hospitalizations or medication changes.She has bought a house in [place] and will living there part time. She looks after her elderly mother, who will be going to [place] for the next 6 months. Medications: I have reviewed the patient's current medications.  Allergies:  Allergies  Allergen Reactions  . Sulfa Antibiotics     Type 1 hypersensitivity    Past Medical History, Surgical history, Social history, and Family History were reviewed and updated.  Review of Systems: Constitutional:  Negative for fever, chills, night sweats, anorexia, weight loss, pain. Cardiovascular: negative Respiratory: negative Neurological: negative Dermatological: negative ENT: negative Skin Gastrointestinal: negative Genito-Urinary: negative Hematological and Lymphatic: negative Breast: negative Musculoskeletal: negative Remaining ROS negative.  Physical Exam: Blood pressure 116/78, pulse 86, temperature 98.3 F (36.8 C), temperature source Oral, height 4\' 10"  (1.473 m), weight 137 lb 6.4 oz (62.324 kg). ECOG: 0 General appearance: alert, cooperative and appears stated age Head: Normocephalic, without obvious abnormality, atraumatic Neck: no adenopathy, no carotid bruit, no JVD, supple, symmetrical, trachea midline and thyroid not enlarged, symmetric, no tenderness/mass/nodules Lymph nodes: Cervical, supraclavicular, and axillary nodes normal. Cardiac : regular rate and rhythm Pulmonary:clear to auscultation bilaterally Breasts: inspection negative, no nipple discharge or bleeding, no masses or nodularity palpable Abdomen:soft, non-tender; bowel sounds normal; no masses,  no  organomegaly Extremities negative Neuro: alert, oriented, normal speech, no focal findings or movement disorder noted  Lab Results: Lab Results  Component Value Date   WBC 4.4 09/22/2011   HGB 13.1 09/22/2011   HCT 38.9 09/22/2011   MCV 83.3 09/22/2011   PLT 174 09/22/2011     Chemistry      Component Value Date/Time   NA 140 03/26/2011 1100   K 3.5 03/26/2011 1100   CL 101 03/26/2011 1100   CO2 30 03/26/2011 1100   BUN 13 03/26/2011 1100   CREATININE 0.72 03/26/2011 1100      Component Value Date/Time   CALCIUM 10.2 03/26/2011 1100   ALKPHOS 59 09/20/2010 1404   AST 13 09/20/2010 1404   ALT 14 09/20/2010 1404   BILITOT 0.4 09/20/2010 1404      .pathology. Radiological Studies: chest X-ray n/a Mammogram 1/14 Bone density 1/14  Impression and Plan: She is doing well, without evidence of malignancy, I will see he rin 1 yr, with restaging exams  More than 50% of the visit was spent in patient-related counselling   Pierce Crane, MD 4/8/201311:51 AM

## 2011-09-24 ENCOUNTER — Other Ambulatory Visit: Payer: Managed Care, Other (non HMO) | Admitting: Lab

## 2011-09-24 ENCOUNTER — Ambulatory Visit: Payer: Managed Care, Other (non HMO) | Admitting: Oncology

## 2012-03-19 ENCOUNTER — Encounter: Payer: Managed Care, Other (non HMO) | Admitting: *Deleted

## 2012-03-19 ENCOUNTER — Encounter: Payer: Self-pay | Admitting: *Deleted

## 2012-03-19 DIAGNOSIS — E7800 Pure hypercholesterolemia, unspecified: Secondary | ICD-10-CM

## 2012-03-25 NOTE — Progress Notes (Signed)
03/25/12 @ 10:30am, NSABP-B-42, Month 54 Drug Exchange:  Suzanne Carney into the [facility] to receive the last two bottles of study drug (letrozole/placebo) for the B-42 study- Bottles 19 and 20.  She returned Bottles 17 and 18 with 53 tablets remaining in bottle # 18.  She says that she has taken study drug almost all days.  By pill count she missed 18 of a potential 185 doses (about 10%).  Suzanne Carney denies any new adverse events, arterial clots, osteoporotic bone fractures, cardiac problems, new lipid therapy or bisphosphonate therapy. Her BMD is normal and she is not due for another DEXA until Jan, 2015.  Her mammogram is scheduled for Jan, 2014.  Will obtain a lipid panel with her next MD appointment in April, 2014.  Pharmacy dispensed bottles 19 and 20 and Suzanne Carney agreed to continue to take one tab po daily.

## 2012-03-26 ENCOUNTER — Encounter: Payer: Self-pay | Admitting: *Deleted

## 2012-03-29 ENCOUNTER — Telehealth: Payer: Self-pay | Admitting: *Deleted

## 2012-03-29 NOTE — Telephone Encounter (Signed)
03/29/12 @ 11:20am:  Suzanne Carney called to say that she was contacted by her PCP's office and informed that she had a elevated serum calcium level.  She was instructed to stop her calcium supplement and have it repeated in one month.  She was told this could be a sign of a malignancy.  She informed the PCP office that she has an oncologist and requested the lab be forwarded to Dr. Donnie Coffin.  Of course, she was very concerned.  Advised her that the repeat labs in one month should also be shared with Dr. Donnie Coffin.  Reassured her that I would inform Dr. Donnie Coffin and his nurse about the lab values and she will be contacted if deemed necessary.

## 2012-07-08 ENCOUNTER — Ambulatory Visit: Payer: Managed Care, Other (non HMO)

## 2012-07-16 ENCOUNTER — Telehealth: Payer: Self-pay | Admitting: *Deleted

## 2012-07-16 NOTE — Telephone Encounter (Signed)
Left message for pt to return my call about her appt.

## 2012-07-19 ENCOUNTER — Telehealth: Payer: Self-pay | Admitting: *Deleted

## 2012-07-20 NOTE — Telephone Encounter (Signed)
Left message for pt to return my call.

## 2012-07-27 ENCOUNTER — Encounter: Payer: Self-pay | Admitting: Oncology

## 2012-07-27 ENCOUNTER — Telehealth: Payer: Self-pay | Admitting: *Deleted

## 2012-07-27 NOTE — Progress Notes (Signed)
This encounter was created in error - please disregard.

## 2012-07-27 NOTE — Telephone Encounter (Signed)
Confirmed 08/16/12 appt w/ pt.  Mailed letter & calendar to pt.

## 2012-07-30 ENCOUNTER — Encounter: Payer: Self-pay | Admitting: Oncology

## 2012-08-13 ENCOUNTER — Ambulatory Visit
Admission: RE | Admit: 2012-08-13 | Discharge: 2012-08-13 | Disposition: A | Discharge status: Home / Self Care | Payer: Managed Care, Other (non HMO) | Source: Ambulatory Visit | Attending: Oncology | Admitting: Oncology

## 2012-08-16 ENCOUNTER — Encounter: Payer: Self-pay | Admitting: *Deleted

## 2012-08-16 ENCOUNTER — Telehealth: Payer: Self-pay | Admitting: Oncology

## 2012-08-16 ENCOUNTER — Encounter: Payer: Self-pay | Admitting: Family

## 2012-08-16 ENCOUNTER — Ambulatory Visit (HOSPITAL_BASED_OUTPATIENT_CLINIC_OR_DEPARTMENT_OTHER): Payer: Managed Care, Other (non HMO) | Admitting: Family

## 2012-08-16 VITALS — BP 118/81 | HR 90 | Temp 98.1°F | Resp 20 | Ht <= 58 in | Wt 137.1 lb

## 2012-08-16 MED ORDER — LETROZOLE/PLACEBO 2.5MG TABS #110 NSABP B-42: 2.5000 mg | ORAL_TABLET | Freq: Every day | ORAL | Status: AC

## 2012-08-16 NOTE — Telephone Encounter (Signed)
appts made, printed, and given to pt for 08/15/2013...td

## 2012-08-16 NOTE — Progress Notes (Signed)
08/16/12 @ 15:00, NSABP B-42 End of Treatment Note:  Suzanne Carney came into the [facility] to return her last supply of study drug and to have a physical examination.  She returned bottles 19 and 20 with 83 unused tablets of letrozole/placebo and reports that her last dose was 08/15/12. She is discontinuing study treatment approximately one month early due to the fact that she is moving to [place] and does not plan to return to the [facility] for care.  Her diagnosis of breast cancer was nearly 11 years ago, therefore her plan is to move her care to a PCP rather than an oncologist in [place].  She denies any side effects or adverse events today, no osteoporotic fractures, arterial blood clots or cardiac issues.  She has not started a bisphosphonate or any lipid lowering medications.  I will plan to follow up with a phone call in about six months.  Suzanne Carney agreed to email me the information about her new PCP so that we can follow her through that office.

## 2012-08-16 NOTE — Patient Instructions (Addendum)
Please contact us at [phone] if you have any questions or concerns. 

## 2012-08-17 NOTE — Progress Notes (Signed)
[facility]  [phone] [phone]  OFFICE PROGRESS NOTE  PATIENT: Suzanne Carney   DOB: [birth year]  MR#: [id number]  FIE#:[id number]  CC:  Lovenia Kim, PA-C Margaretmary Dys, MD   DIAGNOSIS: 62 year old woman with invasive ductal carcinoma of the left breast diagnosed in 2003.  PRIOR THERAPY: 1. Status post left breast needle biopsy on 11/04/2001 which showed ER +98%, PR +64%, Ki-67 6%, HER-2/neu non-amplified with a ratio of 1.06.  2. Status post left breast lumpectomy on 11/22/2001 for stage IIB, T2 N1a,  invasive ductal mammary carcinoma, 2.2 cm, grade 1 with high-grade DCIS, sentinel lymph node biopsy of the left axilla showed 1/1 positive lymph node for metastatic carcinoma. Ruptured left breast cysts were also noted.  3. Status post reexcision of tissue on 12/06/2001 from left superior margin of breast, no residual or invasive in situ carcinoma seen.   Left lymph node dissection with 1 of 10 lymph nodes positive for isolated tumor cells (ITC), 0 of 10 lymph nodes positive for metastatic carcinoma.  4. Status post TAC chemotherapy x 6 completed 04/26/2002.  5. Radiation therapy from 05/31/2003 through 07/16/2002.  6. NSABP-B 42 research protocol participant from 12/15/06 through 08/15/2012. 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   7.  Status post five years of adjuvant hormonal therapy with Letrozole completed 06/2007.  She also received investigational Letrozole/ placebo as part of the B42 research protocol until 08/15/2012.   CURRENT THERAPY: Observation    INTERVAL HISTORY: Dr. Welton Flakes and I saw Suzanne Carney today for followup of invasive ductal carcinoma. She was last seen by Dr. Donnie Coffin on 09/22/2011. Since her last office visit the patient states that she has pain and a "lump" behind her right ear that has progressively worsened. This has been brought  to the attention of her primary care provider's office. The patient denies any other symptomatology. The patient's primary residence is in the state of [place] but she states that she still owns a home in and visits [place].    PAST MEDICAL HISTORY: Past Medical History  Diagnosis Date  . Breast cancer   . Lymphedema   . Hypokalemia     PAST SURGICAL HISTORY: Past Surgical History  Procedure Laterality Date  . Breast lumpectomy Left 2003  . Breast reduction surgery Right 2005  . Breast biopsy    . Foot fracture surgery Left 2008  . Hand surgery Right 2011     FAMILY HISTORY: Family History  Problem Relation Age of Onset  . Rheum arthritis Mother   . Cancer Father     Lung CA    SOCIAL HISTORY: History  Substance Use Topics  . Smoking status: Former Smoker    Quit date: 08/17/2011  . Smokeless tobacco: Never Used  . Alcohol Use: Yes     Comment: occasional beer    ALLERGIES: Allergies  Allergen Reactions  . Sulfa Antibiotics     Type 1 hypersensitivity     MEDICATIONS:  Current Outpatient Prescriptions  Medication Sig Dispense Refill  . Calcium Carb-Cholecalciferol (CALCIUM 500/D) 500-400 MG-UNIT CHEW Chew 2 tablets by mouth daily.        . Cholecalciferol (VITAMIN D) 1000 UNITS capsule Take 1,000 Units by mouth daily.        . Cyanocobalamin (VITAMIN B 12 PO) Take 1 tablet by mouth daily.        . furosemide (LASIX) 20  MG tablet Take 20 mg by mouth daily.        . hydrochlorothiazide (HYDRODIURIL) 25 MG tablet Take 25 mg by mouth daily.        . polycarbophil (FIBERCON) 625 MG tablet Take 625 mg by mouth daily.      . Investigational letrozole/placebo tablet NSABP B-42 Take 1 tablet by mouth daily.  220 tablet  0  . Potassium Chloride (K-TABS PO) Take 30 mEq by mouth daily.        No current facility-administered medications for this visit.      REVIEW OF SYSTEMS: A 10 point review of systems was completed and is negative except as noted above.     PHYSICAL EXAMINATION: BP 118/81  Pulse 90  Temp(Src) 98.1 F (36.7 C) (Oral)  Resp 20  Ht 4\' 10"  (1.473 m)  Wt 137 lb 1.6 oz (62.188 kg)  BMI 28.66 kg/m2   General appearance: Alert, cooperative, well nourished, no apparent distress Head: Normocephalic, without obvious abnormality, atraumatic Eyes: Conjunctivae/corneas clear, PERRLA, EOMI Nose: Nares, septum and mucosa are normal, no drainage or sinus tenderness. Neck: No adenopathy, supple, symmetrical, trachea midline, thyroid not enlarged, no tenderness Resp: Clear to auscultation bilaterally Cardio: Regular rate and rhythm, S1, S2 normal, no murmur, click, rub or gallop Breasts: Soft bilaterally, left breast well-healed surgical scar, radiation changes to left breast, no lymphadenopathy, no nipple inversion, no axilla fullness, benign breast exam GI: Soft, distended, non-tender, hypoactive bowel sounds, no organomegaly Extremities: Extremities normal, atraumatic, no cyanosis or edema Lymph nodes: Cervical, supraclavicular, and axillary nodes normal Neurologic: Grossly normal    ECOG FS:  Grade 0 - Fully active            LAB RESULTS: Lab Results  Component Value Date   WBC 4.4 09/22/2011   NEUTROABS 2.4 09/22/2011   HGB 13.1 09/22/2011   HCT 38.9 09/22/2011   MCV 83.3 09/22/2011   PLT 174 09/22/2011      Chemistry      Component Value Date/Time   NA 139 09/22/2011 1019   K 3.7 09/22/2011 1019   CL 104 09/22/2011 1019   CO2 26 09/22/2011 1019   BUN 13 09/22/2011 1019   CREATININE 0.62 09/22/2011 1019      Component Value Date/Time   CALCIUM 9.5 09/22/2011 1019   ALKPHOS 61 09/22/2011 1019   AST 15 09/22/2011 1019   ALT 13 09/22/2011 1019   BILITOT 0.4 09/22/2011 1019      Lab Results  Component Value Date   LABCA2 19 09/22/2011     RADIOGRAPHIC STUDIES: Mm Digital Screening 08/16/2012  *RADIOLOGY REPORT*  Clinical Data: Screening.  DIGITAL BILATERAL SCREENING MAMMOGRAM WITH CAD  Comparison:  07/04/2010  FINDINGS:  ACR Breast  Density Category 3: The breast tissue is heterogeneously dense.  There is no suspicious dominant mass, architectural distortion, or calcification to suggest malignancy.  Left lumpectomy changes are present.  Excisional biopsy edges are present on the right.  Images were processed with CAD.  IMPRESSION: No mammographic evidence of malignancy.  A result letter of this screening mammogram will be mailed directly to the patient.  RECOMMENDATION: Screening mammogram in one year. (Code:SM-B-01Y)  BI-RADS CATEGORY 1:  Negative.   Original Report Authenticated By: Cain Saupe, M.D.     ASSESSMENT: 62 y.o. with: 1. T2 N1a, stage IIB,  invasive ductal carcinoma of the left breast, grade 1, and PR 98%, PR 64%, Ki-67 6%, HER-2 neu negative, with 1/1 positive lymph node. Status post  chemotherapy with 6 cycles of TAC completed in 04/2002, status post radiation therapy completed in 06/2002 status post nearly 10 years of antiestrogen therapy with Letrozole. Status post NSABP-B42 research study participant.   PLAN: 1. We plan to see the patient again in one year as she prefers to continue annual followup. She did not have time to complete laboratories today, so a prescription was written to complete a CMP, CBC and vitamin D level at a laboratory near the patient once she returns to [place]. We will also check a CBC, CMP and vitamin D level during her next office visit in one year.  2. We have asked the patient to followup with her primary care physician regarding the "lump" behind her right ear that causes her pain.  All questions were answered.  The patient was encouraged to contact us in the interim with any problems, questions or concerns.    Larina Bras, NP-C 08/17/2012, 6:59 PM

## 2012-09-20 ENCOUNTER — Other Ambulatory Visit: Payer: Managed Care, Other (non HMO) | Admitting: Lab

## 2012-09-21 ENCOUNTER — Ambulatory Visit: Payer: Managed Care, Other (non HMO) | Admitting: Oncology

## 2012-09-21 ENCOUNTER — Other Ambulatory Visit: Payer: Managed Care, Other (non HMO) | Admitting: Lab

## 2013-02-09 ENCOUNTER — Encounter: Payer: Self-pay | Admitting: *Deleted

## 2013-02-09 NOTE — Progress Notes (Signed)
02/09/13 @ 14:45, NSABP B-42, 66 Month AE Assessment:  Contacted Suzanne Carney by phone for final AE assessment.  She denied any osteoporotic bone fractures, cardiac or clotting issues.  In fact, she reported no changes in her health whatsoever since her visit here in March, 2014.  She has not had her cholesterol checked, nor has she had a bone mineral density since then. She has not been hospitalized for any reason.  Her mammograms are up to date, with the next due in late February, 2014.  Thanked her again for her continued support of the study.

## 2013-02-10 IMAGING — MG MM DIGITAL SCREENING BILAT W/ CAD
4 series · 4 of 4 positions shown · non-contrast
Comparison: none

DG SCREEN MAMMOGRAM BILATERAL
Bilateral CC and MLO view(s) were taken.
Technologist: John Mckay Montesin

DIGITAL SCREENING MAMMOGRAM WITH CAD:
The breast tissue is heterogeneously dense.  No masses or malignant type calcifications are 
identified.  Compared with prior studies.
Images were processed with CAD.

[R CC]
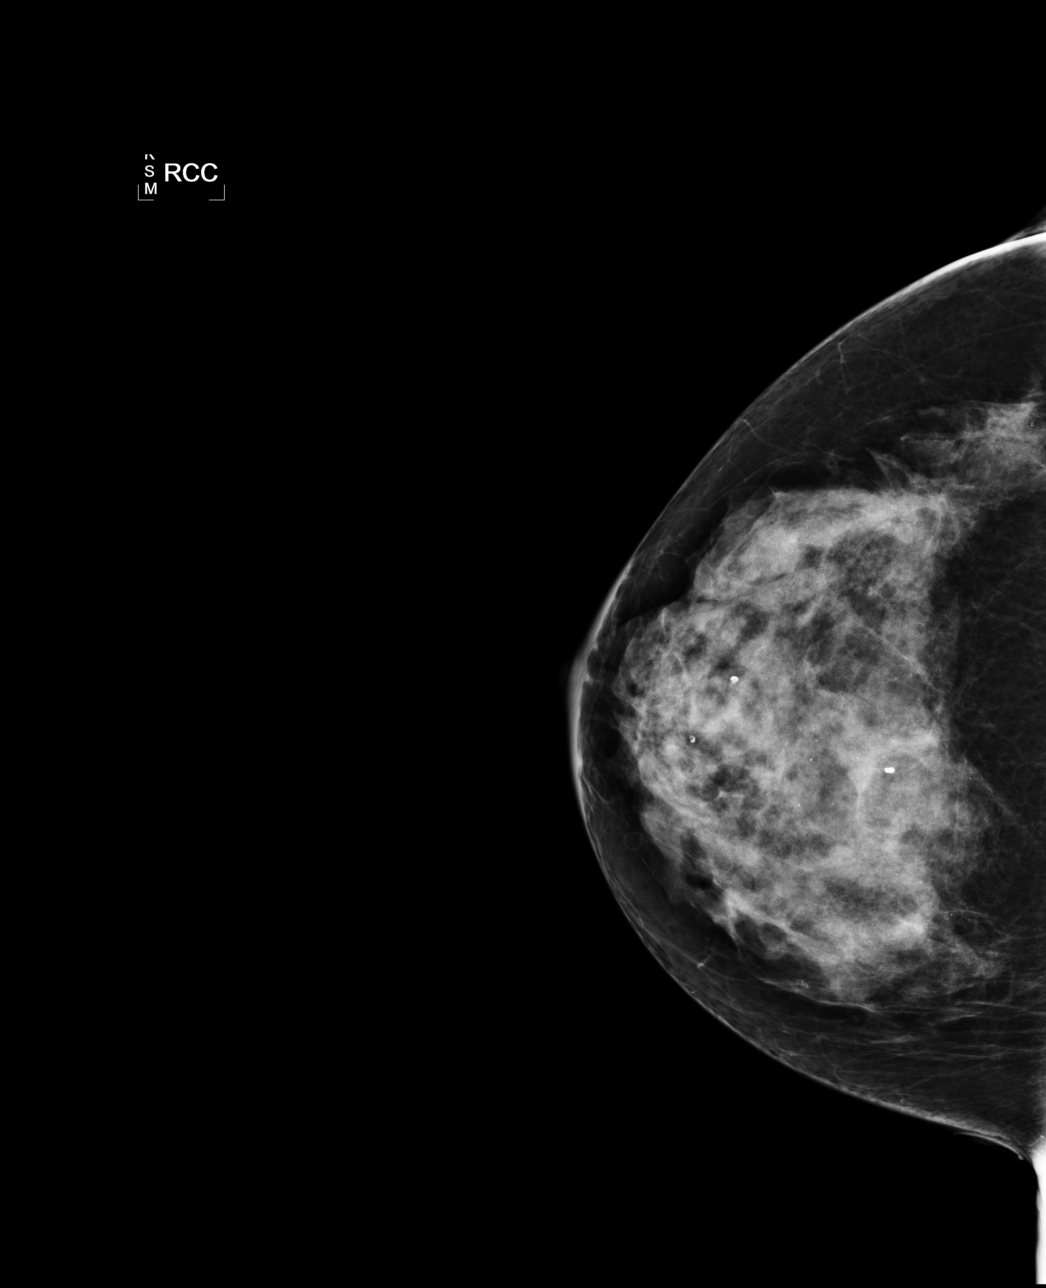

[L CC]
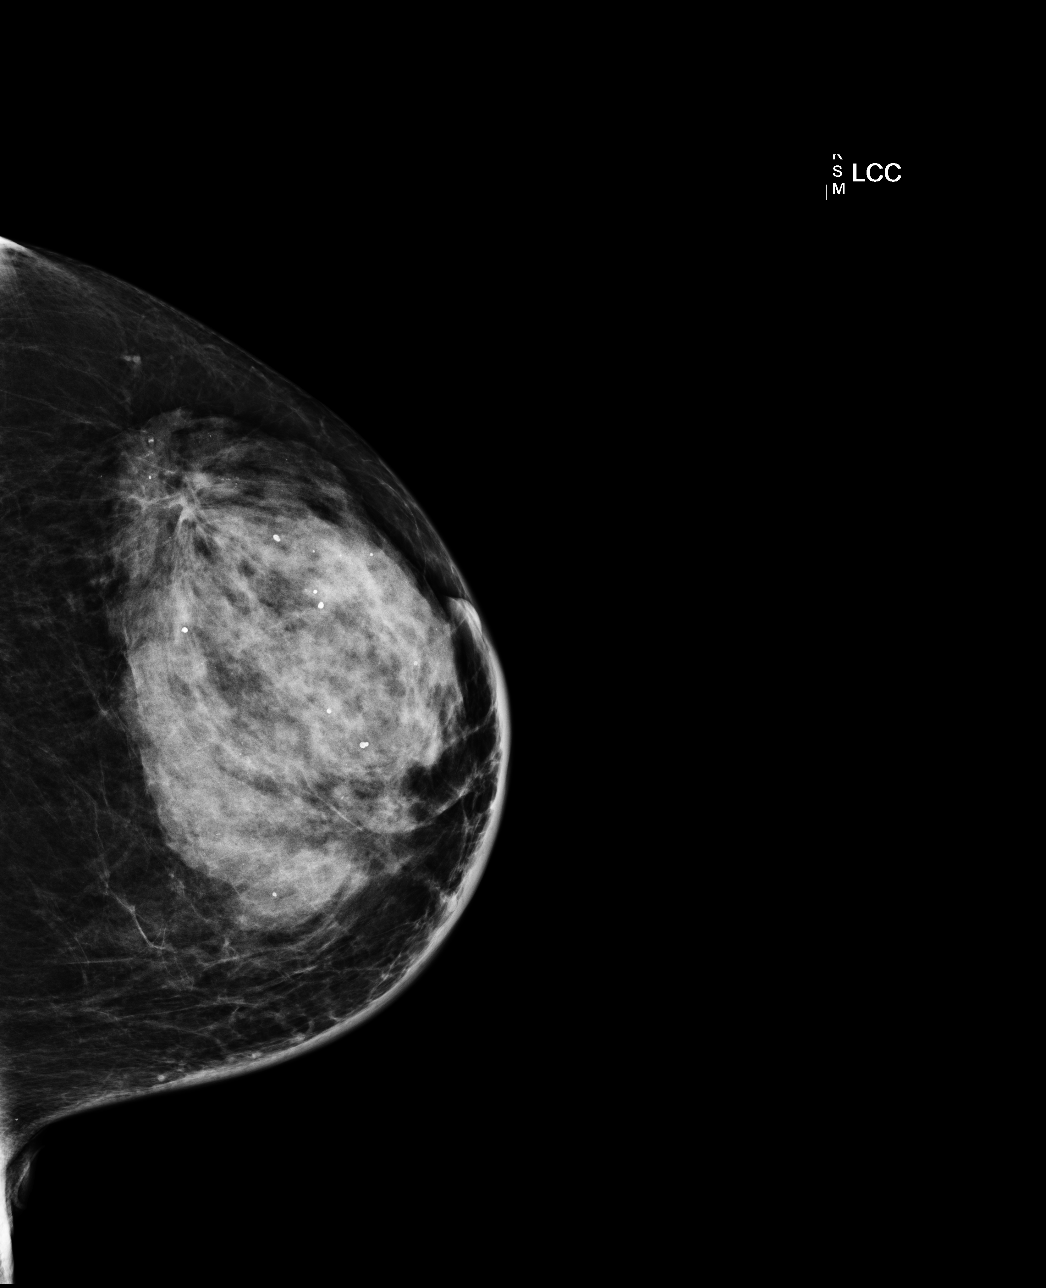

[L MLO]
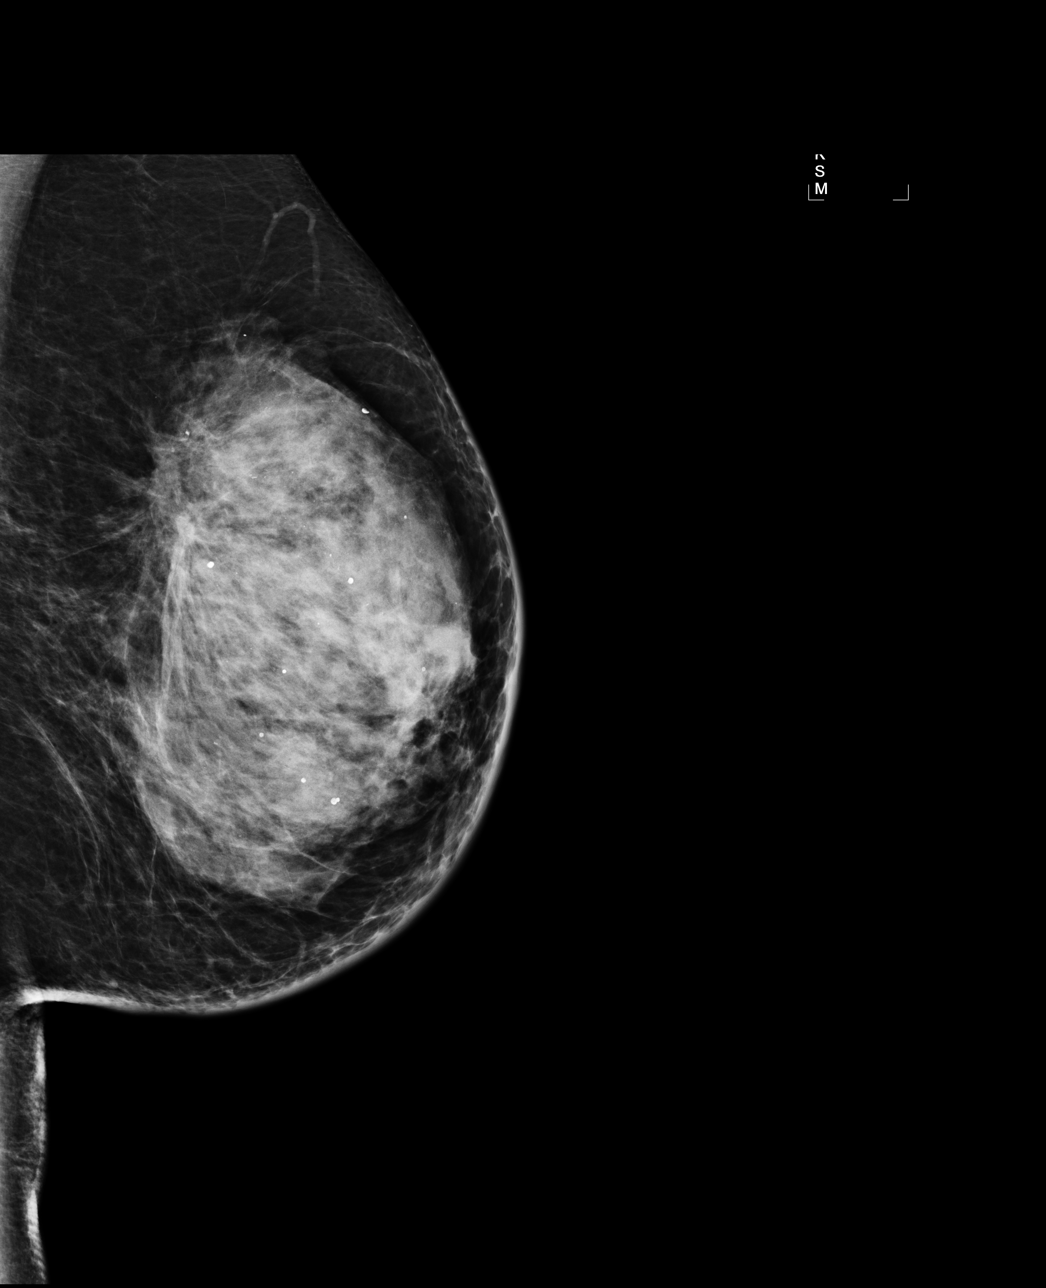

[R MLO]
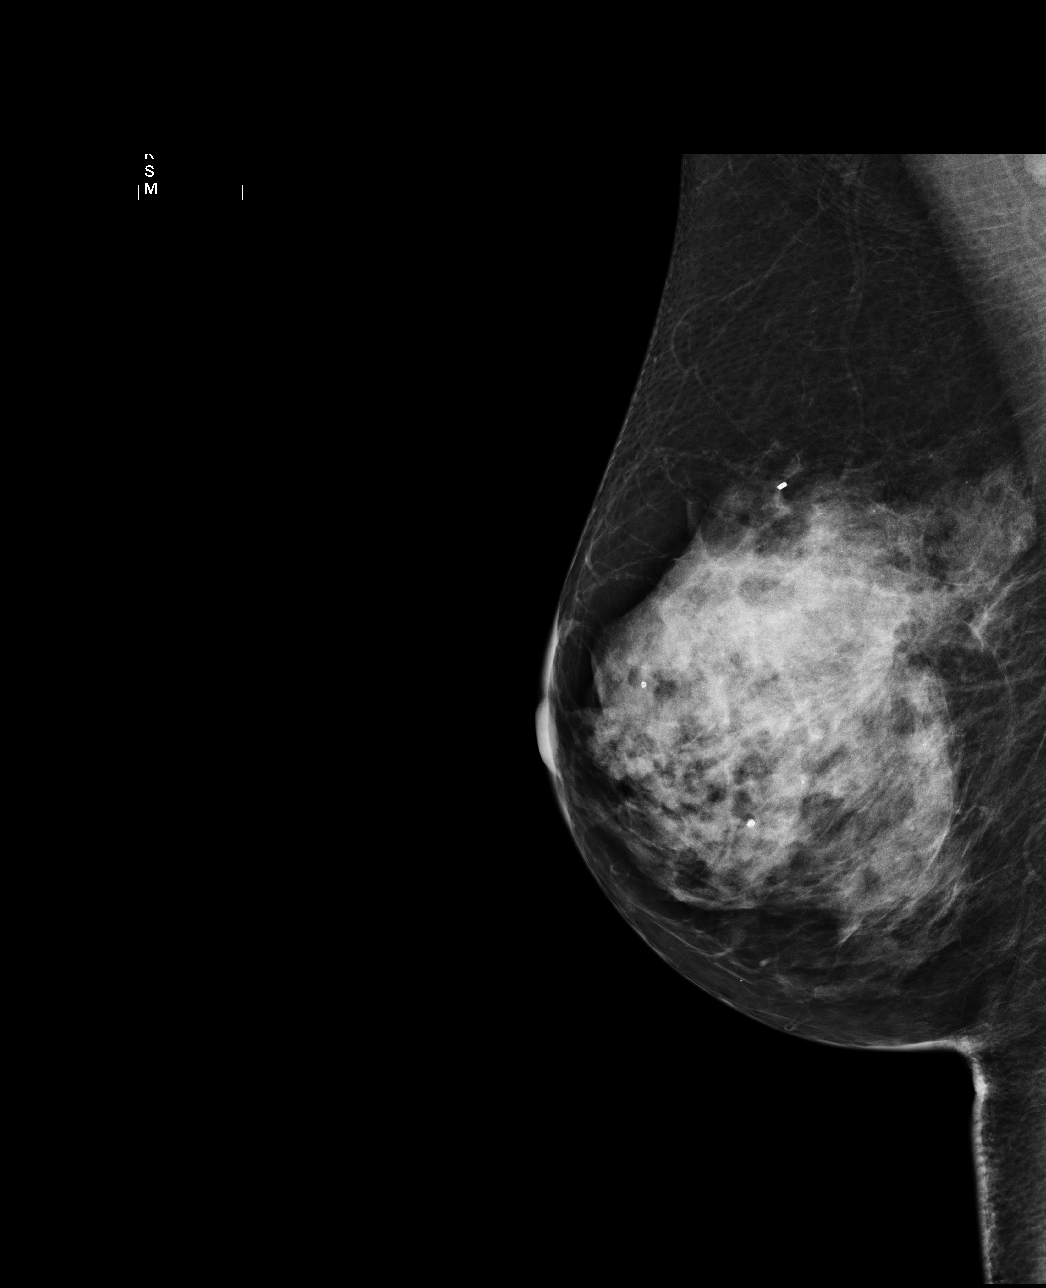

[4 of 4 positions shown; findings below may reference images not displayed]

IMPRESSION: No specific mammographic evidence of malignancy.  Next screening mammogram is recommended in one 
year.

A result letter of this screening mammogram will be mailed directly to the patient.

ASSESSMENT: Negative - BI-RADS 1

Screening mammogram in 1 year.
,

## 2013-07-22 ENCOUNTER — Other Ambulatory Visit: Payer: Self-pay

## 2013-07-22 DIAGNOSIS — Z1231 Encounter for screening mammogram for malignant neoplasm of breast: Secondary | ICD-10-CM

## 2013-08-12 ENCOUNTER — Other Ambulatory Visit: Payer: Self-pay | Admitting: *Deleted

## 2013-08-12 DIAGNOSIS — C50919 Malignant neoplasm of unspecified site of unspecified female breast: Secondary | ICD-10-CM

## 2013-08-15 ENCOUNTER — Encounter: Payer: Self-pay | Admitting: Oncology

## 2013-08-15 ENCOUNTER — Ambulatory Visit (HOSPITAL_BASED_OUTPATIENT_CLINIC_OR_DEPARTMENT_OTHER): Payer: Managed Care, Other (non HMO) | Admitting: Oncology

## 2013-08-15 ENCOUNTER — Telehealth: Payer: Self-pay | Admitting: *Deleted

## 2013-08-15 ENCOUNTER — Other Ambulatory Visit (HOSPITAL_BASED_OUTPATIENT_CLINIC_OR_DEPARTMENT_OTHER): Payer: Managed Care, Other (non HMO)

## 2013-08-15 ENCOUNTER — Ambulatory Visit
Admission: RE | Admit: 2013-08-15 | Discharge: 2013-08-15 | Disposition: A | Discharge status: Home / Self Care | Payer: Managed Care, Other (non HMO) | Source: Ambulatory Visit

## 2013-08-15 VITALS — BP 115/78 | HR 90 | Temp 97.9°F | Resp 18 | Ht <= 58 in | Wt 139.0 lb

## 2013-08-15 DIAGNOSIS — E876 Hypokalemia: Secondary | ICD-10-CM

## 2013-08-15 DIAGNOSIS — Z853 Personal history of malignant neoplasm of breast: Secondary | ICD-10-CM

## 2013-08-15 DIAGNOSIS — Z1231 Encounter for screening mammogram for malignant neoplasm of breast: Secondary | ICD-10-CM

## 2013-08-15 DIAGNOSIS — C50919 Malignant neoplasm of unspecified site of unspecified female breast: Secondary | ICD-10-CM

## 2013-08-15 DIAGNOSIS — I890 Lymphedema, not elsewhere classified: Secondary | ICD-10-CM

## 2013-08-15 LAB — COMPREHENSIVE METABOLIC PANEL (CC13)
ALT: 25 U/L (ref 0–55)
ANION GAP: 8 meq/L (ref 3–11)
AST: 17 U/L (ref 5–34)
Albumin: 4 g/dL (ref 3.5–5.0)
Alkaline Phosphatase: 70 U/L (ref 40–150)
BILIRUBIN TOTAL: 0.22 mg/dL (ref 0.20–1.20)
BUN: 15.1 mg/dL (ref 7.0–26.0)
CO2: 30 meq/L — AB (ref 22–29)
CREATININE: 0.7 mg/dL (ref 0.6–1.1)
Calcium: 10.2 mg/dL (ref 8.4–10.4)
Chloride: 104 mEq/L (ref 98–109)
GLUCOSE: 96 mg/dL (ref 70–140)
Potassium: 3 mEq/L — CL (ref 3.5–5.1)
Sodium: 142 mEq/L (ref 136–145)
Total Protein: 7.3 g/dL (ref 6.4–8.3)

## 2013-08-15 LAB — CBC WITH DIFFERENTIAL/PLATELET
BASO%: 0.7 % (ref 0.0–2.0)
Basophils Absolute: 0.1 10*3/uL (ref 0.0–0.1)
EOS%: 1.6 % (ref 0.0–7.0)
Eosinophils Absolute: 0.1 10*3/uL (ref 0.0–0.5)
HEMATOCRIT: 42.9 % (ref 34.8–46.6)
HGB: 13.9 g/dL (ref 11.6–15.9)
LYMPH%: 26.2 % (ref 14.0–49.7)
MCH: 27.7 pg (ref 25.1–34.0)
MCHC: 32.3 g/dL (ref 31.5–36.0)
MCV: 85.8 fL (ref 79.5–101.0)
MONO#: 0.6 10*3/uL (ref 0.1–0.9)
MONO%: 7 % (ref 0.0–14.0)
NEUT#: 5.8 10*3/uL (ref 1.5–6.5)
NEUT%: 64.5 % (ref 38.4–76.8)
PLATELETS: 247 10*3/uL (ref 145–400)
RBC: 5 10*6/uL (ref 3.70–5.45)
RDW: 14.5 % (ref 11.2–14.5)
WBC: 9.1 10*3/uL (ref 3.9–10.3)
lymph#: 2.4 10*3/uL (ref 0.9–3.3)

## 2013-08-15 NOTE — Patient Instructions (Signed)
Hypokalemia Hypokalemia means that the amount of potassium in the blood is lower than normal.Potassium is a chemical, called an electrolyte, that helps regulate the amount of fluid in the body. It also stimulates muscle contraction and helps nerves function properly.Most of the body's potassium is inside of cells, and only a very small amount is in the blood. Because the amount in the blood is so small, minor changes can be life-threatening. CAUSES  Antibiotics.  Diarrhea or vomiting.  Using laxatives too much, which can cause diarrhea.  Chronic kidney disease.  Water pills (diuretics).  Eating disorders (bulimia).  Low magnesium level.  Sweating a lot. SIGNS AND SYMPTOMS  Weakness.  Constipation.  Fatigue.  Muscle cramps.  Mental confusion.  Skipped heartbeats or irregular heartbeat (palpitations).  Tingling or numbness. DIAGNOSIS  Your health care provider can diagnose hypokalemia with blood tests. In addition to checking your potassium level, your health care provider may also check other lab tests. TREATMENT Hypokalemia can be treated with potassium supplements taken by mouth or adjustments in your current medicines. If your potassium level is very low, you may need to get potassium through a vein (IV) and be monitored in the hospital. A diet high in potassium is also helpful. Foods high in potassium are:  Nuts, such as peanuts and pistachios.  Seeds, such as sunflower seeds and pumpkin seeds.  Peas, lentils, and lima beans.  Whole grain and bran cereals and breads.  Fresh fruit and vegetables, such as apricots, avocado, bananas, cantaloupe, kiwi, oranges, tomatoes, asparagus, and potatoes.  Orange and tomato juices.  Red meats.  Fruit yogurt. HOME CARE INSTRUCTIONS  Take all medicines as prescribed by your health care provider.  Maintain a healthy diet by including nutritious food, such as fruits, vegetables, nuts, whole grains, and lean meats.  If  you are taking a laxative, be sure to follow the directions on the label. SEEK MEDICAL CARE IF:  Your weakness gets worse.  You feel your heart pounding or racing.  You are vomiting or having diarrhea.  You are diabetic and having trouble keeping your blood glucose in the normal range. SEEK IMMEDIATE MEDICAL CARE IF:  You have chest pain, shortness of breath, or dizziness.  You are vomiting or having diarrhea for more than 2 days.  You faint. MAKE SURE YOU:   Understand these instructions.  Will watch your condition.  Will get help right away if you are not doing well or get worse. Document Released: 06/02/2005 Document Revised: 03/23/2013 Document Reviewed: 12/03/2012 [facility] Patient Information 2014 [place].  Lymphedema Lymphedema is a swelling caused by the abnormal collection of lymph under the skin. The lymph is fluid from the tissues in your body that travels in the lymphatic system. This system is part of the immune system that includes lymph nodes and vessels. The lymph vessels collect and carry the excess fluid, fats, proteins, and wastes from the tissues of the body to the bloodstream. This system also works to clean and remove bacteria and waste products from the body.  Lymphedema occurs when the lymphatic system is blocked. When the lymph vessels or lymph nodes are blocked or damaged, lymph does not drain properly. This causes abnormal build up of lymph. This leads to swelling in the arms or legs. Lymphedema cannot be cured by medicines. But the swelling can be reduced by physical methods. CAUSES  There are two types of lymphedema. Primary lymphedema is caused by the absence or abnormality of the lymph vessel at birth. It  is also known as inherited lymphedema, which occurs rarely. Secondary or acquired lymphedema occurs when the lymph vessel is damaged or blocked. The causes of lymph vessel blockage are:   Skin infection like cellulites.  Infection by parasites  (filariasis).  Injury.  Cancer.  Radiation therapy.  Formation of scar tissue.  Surgery. SYMPTOMS  The symptoms of lymphedema are:  Abnormal swelling of the arm or leg.  Heavy or tight feeling in your arm or leg.  Tight-fitting shoes or rings.  Redness of skin over the affected area.  Limited movement of the affected limb.  Some patients complain about sensitivity to touch and discomfort in the limb(s) affected. You may not have these symptoms immediately following injury. They usually appear within a few days or even years after injury. Inform your caregiver, if you have any of these symptoms. Early treatment can avoid further problems.  DIAGNOSIS  First, your caregiver will inquire about any surgery you have had or medicines you are taking. He will then examine you. Your caregiver may order special imaging tests, such as:  Lymphoscintigraphy (a test in which a low dose of radioactive substance is injected to trace the flow of lymph through the lymph vessels).  MRI (imaging tests using magnetic fields).  Computed tomography (test using special cross-sectional X-rays).  Duplex ultrasound (test using high-frequency sound waves to show the vessels and the blood flow on a screen).  Lymphangiography (special X-ray taken after injecting a contrast dye into the lymph vessel). It is now rarely done. TREATMENT  Lymphedema can be treated in different ways. Your caregiver will decide the type of treatment depending on the cause. Treatment may include:  Exercise: Special exercises will help fluid move out easily from the affected part. This should be done as per your caregiver's advice.  Manual lymph drainage: Gentle massage of the affected limb makes the fluid to move out more freely.  Compression: Compression stockings or external pump apply pressure over the affected limb. This helps the fluid to move out from the arm or leg. Bandaging can also help to move the fluid out from the  affected part. Your caregiver will decide the method that suits you the best.  Medicines: Your caregiver may prescribe antibiotics, if you have infection.  Surgery: Your caregiver may advise surgery for severe lymphedema. It is reserved for special cases when the patient has difficulty moving. Your surgeon may remove excess tissue from the arm or leg. This will help to ease your movement. Physical therapy may have to be continued after surgery. HOME CARE INSTRUCTIONS  The area is very fragile and is predisposed to injury and infection.  Eat a healthy diet.  Exercise regularly as per advice.  Keep the affected area clean and dry.  Use gloves while cooking or gardening.  Protect your skin from cuts.  Use electric razor to shave the affected area.  Keep affected limb elevated.  Do not wear tight clothes, shoes, or jewelry as it may cause the tissue to be strangled.  Do not use heat pads over the affected area.  Do not sit with cross legs.  Do not walk barefoot.  Do not carry weight on the affected arm.  Avoid having blood pressure checked on the affected limb. SEEK MEDICAL CARE IF:  You continue to have swelling in your limb. SEEK IMMEDIATE MEDICAL CARE IF:   You have high fever.  You have skin rash.  You have chills or sweats.  You have pain or redness.  You  have a cut that does not heal. MAKE SURE YOU:   Understand these instructions.  Will watch your condition.  Will get help right away if you are not doing well or get worse. Document Released: 03/30/2007 Document Revised: 05/19/2012 Document Reviewed: 03/05/2009 [facility] Patient Information 2014 [place], [place].

## 2013-08-15 NOTE — Progress Notes (Signed)
[facility]  [phone] Fax:([phone]  OFFICE PROGRESS NOTE  PATIENT: Suzanne Carney   DOB: 1950-07-07  MR#: [id number]  GXQ#:[id number]  CC:  Corine Shelter, PA-C Tyler Pita, MD   DIAGNOSIS: 63 year old woman with invasive ductal carcinoma of the left breast diagnosed in 2003.  PRIOR THERAPY: 1. Status post left breast needle biopsy on 11/04/2001 which showed ER +98%, PR +64%, Ki-67 6%, HER-2/neu non-amplified with a ratio of 1.06.  2. Status post left breast lumpectomy on 11/22/2001 for stage IIB, T2 N1a,  invasive ductal mammary carcinoma, 2.2 cm, grade 1 with high-grade DCIS, sentinel lymph node biopsy of the left axilla showed 1/1 positive lymph node for metastatic carcinoma. Ruptured left breast cysts were also noted.  3. Status post reexcision of tissue on 12/06/2001 from left superior margin of breast, no residual or invasive in situ carcinoma seen.   Left lymph node dissection with 1 of 10 lymph nodes positive for isolated tumor cells (ITC), 0 of 10 lymph nodes positive for metastatic carcinoma.  4. Status post TAC chemotherapy x 6 completed 04/26/2002.  5. Radiation therapy from 05/31/2003 through 07/16/2002.  6. NSABP-B 42 research protocol participant from 12/15/06 through 08/15/2012. 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   7.  Status post five years of adjuvant hormonal therapy with Letrozole completed 06/2007.  She also received investigational Letrozole/ placebo as part of the B42 research protocol until 08/15/2012.  CURRENT THERAPY: Observation   INTERVAL HISTORY:  Suzanne Carney is seen  today for followup of invasive ductal carcinoma. She continues to have selling in her left arm. She does not wear the lymphedema sleeve. She does take diuretics for the swelling.The patient denies any other symptomatology. The patient's primary residence is in  the state of [place] but she states that she still owns a home in and visits [place].    PAST MEDICAL HISTORY: Past Medical History  Diagnosis Date  . Breast cancer   . Lymphedema   . Hypokalemia     PAST SURGICAL HISTORY: Past Surgical History  Procedure Laterality Date  . Breast lumpectomy Left 2003  . Breast reduction surgery Right 2005  . Breast biopsy    . Foot fracture surgery Left 2008  . Hand surgery Right 2011     FAMILY HISTORY: Family History  Problem Relation Age of Onset  . Rheum arthritis Mother   . Cancer Father     Lung CA    SOCIAL HISTORY: History  Substance Use Topics  . Smoking status: Former Smoker    Quit date: 08/17/2011  . Smokeless tobacco: Never Used  . Alcohol Use: Yes     Comment: occasional beer    ALLERGIES: Allergies  Allergen Reactions  . Sulfa Antibiotics     Type 1 hypersensitivity     MEDICATIONS:  Current Outpatient Prescriptions  Medication Sig Dispense Refill  . Calcium Carb-Cholecalciferol (CALCIUM 500/D) 500-400 MG-UNIT CHEW Chew 2 tablets by mouth daily.        . Cholecalciferol (VITAMIN D) 1000 UNITS capsule Take 1,000 Units by mouth daily.        . Cyanocobalamin (VITAMIN B 12 PO) Take 1 tablet by mouth daily.        . furosemide (LASIX) 20 MG tablet Take 20 mg by mouth daily.        . hydrochlorothiazide (HYDRODIURIL) 25 MG tablet Take 25 mg by mouth  daily.        . Investigational letrozole/placebo tablet NSABP B-42 Take 1 tablet by mouth daily.  220 tablet  0  . polycarbophil (FIBERCON) 625 MG tablet Take 625 mg by mouth daily.      . Potassium Chloride (K-TABS PO) Take 30 mEq by mouth daily.        No current facility-administered medications for this visit.      REVIEW OF SYSTEMS: A 10 point review of systems was completed and is negative except as noted above.    PHYSICAL EXAMINATION: BP 115/78  Pulse 90  Temp(Src) 97.9 F (36.6 C) (Oral)  Resp 18  Ht _0  (1.473 m)  Wt 139 lb (63.05  kg)  BMI 29.06 kg/m2   General appearance: Alert, cooperative, well nourished, no apparent distress Head: Normocephalic, without obvious abnormality, atraumatic Eyes: Conjunctivae/corneas clear, PERRLA, EOMI Nose: Nares, septum and mucosa are normal, no drainage or sinus tenderness. Neck: No adenopathy, supple, symmetrical, trachea midline, thyroid not enlarged, no tenderness Resp: Clear to auscultation bilaterally Cardio: Regular rate and rhythm, S1, S2 normal, no murmur, click, rub or gallop Breasts: Soft bilaterally, left breast well-healed surgical scar, radiation changes to left breast, no lymphadenopathy, no nipple inversion, no axilla fullness, benign breast exam GI: Soft, distended, non-tender, hypoactive bowel sounds, no organomegaly Extremities: Extremities normal, atraumatic, no cyanosis or edema Lymph nodes: Cervical, supraclavicular, and axillary nodes normal Neurologic: Grossly normal    ECOG FS:  Grade 0 - Fully active            LAB RESULTS: Lab Results  Component Value Date   WBC 9.1 08/15/2013   NEUTROABS 5.8 08/15/2013   HGB 13.9 08/15/2013   HCT 42.9 08/15/2013   MCV 85.8 08/15/2013   PLT 247 08/15/2013      Chemistry      Component Value Date/Time   NA 142 08/15/2013 1353   NA 139 09/22/2011 1019   K 3.0* 08/15/2013 1353   K 3.7 09/22/2011 1019   CL 104 09/22/2011 1019   CO2 30* 08/15/2013 1353   CO2 26 09/22/2011 1019   BUN 15.1 08/15/2013 1353   BUN 13 09/22/2011 1019   CREATININE 0.7 08/15/2013 1353   CREATININE 0.62 09/22/2011 1019      Component Value Date/Time   CALCIUM 10.2 08/15/2013 1353   CALCIUM 9.5 09/22/2011 1019   ALKPHOS 70 08/15/2013 1353   ALKPHOS 61 09/22/2011 1019   AST 17 08/15/2013 1353   AST 15 09/22/2011 1019   ALT 25 08/15/2013 1353   ALT 13 09/22/2011 1019   BILITOT 0.22 08/15/2013 1353   BILITOT 0.4 09/22/2011 1019      Lab Results  Component Value Date   LABCA2 19 09/22/2011     RADIOGRAPHIC STUDIES: Mm Digital Screening 08/16/2012  *RADIOLOGY REPORT*   Clinical Data: Screening.  DIGITAL BILATERAL SCREENING MAMMOGRAM WITH CAD  Comparison:  07/04/2010  FINDINGS:  ACR Breast Density Category 3: The breast tissue is heterogeneously dense.  There is no suspicious dominant mass, architectural distortion, or calcification to suggest malignancy.  Left lumpectomy changes are present.  Excisional biopsy edges are present on the right.  Images were processed with CAD.  IMPRESSION: No mammographic evidence of malignancy.  A result letter of this screening mammogram will be mailed directly to the patient.  RECOMMENDATION: Screening mammogram in one year. (Code:SM-B-01Y)  BI-RADS CATEGORY 1:  Negative.   Original Report Authenticated By: Ulyess Blossom, M.D.     ASSESSMENT: 63 y.o. with:  1. T2 N1a, stage IIB,  invasive ductal carcinoma of the left breast, grade 1, and PR 98%, PR 64%, Ki-67 6%, HER-2 neu negative, with 1/1 positive lymph node. Status post chemotherapy with 6 cycles of TAC completed in 04/2002, status post radiation therapy completed in 06/2002 status post nearly 10 years of antiestrogen therapy with Letrozole. Status post NSABP-B42 research study participant.  2. Hypokalemia  3. Lymphedema    PLAN: 1. We plan to see the patient again in one year as she prefers to continue annual followup.   2. Hypokalemia: recommended patient increase her potassium intake , she will also increase potassium dosage to 4 times a day and follow up with PCP regarding this  3. Left upper extremity Lymphedema: refer to physical therapy, she is also encouraged to wear her sleeve  The length of time of the face-to-face encounter was 25    minutes. More than 50% of time was spent counseling and coordination of care.  All questions were answered.  The patient was encouraged to contact us in the interim with any problems, questions or concerns.   Marcy Panning, MD Medical/Oncology [facility] [phone] (beeper) [phone] (Office)  08/15/2013,  3:02 PM

## 2013-08-15 NOTE — Telephone Encounter (Signed)
appts made and printed...td 

## 2013-08-22 ENCOUNTER — Ambulatory Visit: Payer: Managed Care, Other (non HMO) | Admitting: Physical Therapy

## 2014-07-21 ENCOUNTER — Other Ambulatory Visit: Payer: Self-pay | Admitting: *Deleted

## 2014-08-17 ENCOUNTER — Other Ambulatory Visit: Payer: Self-pay

## 2014-08-17 DIAGNOSIS — C50919 Malignant neoplasm of unspecified site of unspecified female breast: Secondary | ICD-10-CM

## 2014-08-18 ENCOUNTER — Other Ambulatory Visit: Payer: Managed Care, Other (non HMO)

## 2014-08-18 ENCOUNTER — Encounter: Payer: Managed Care, Other (non HMO) | Admitting: Hematology and Oncology

## 2014-08-18 ENCOUNTER — Telehealth: Payer: Self-pay | Admitting: Hematology and Oncology

## 2014-08-18 NOTE — Progress Notes (Signed)
Charted on wrong patient 3/4.  Patient is a no show for today.

## 2014-08-18 NOTE — Telephone Encounter (Signed)
Called pt as she missed an appt today but she is recovering from some surg/complications and will have to call us back to r/s   ane

## 2015-01-09 ENCOUNTER — Telehealth: Payer: Self-pay | Admitting: Hematology and Oncology

## 2015-01-09 NOTE — Telephone Encounter (Signed)
Faxed pt medical records to [facility] [phone]

## 2015-05-29 NOTE — Progress Notes (Signed)
This encounter was created in error - please disregard.

## 2017-10-09 ENCOUNTER — Telehealth: Payer: Self-pay

## 2017-10-09 NOTE — Telephone Encounter (Signed)
Called patient to completed follow up for research study NSABP B-42.  I reached patient at her cell phone [phone]. Patient states she is doing well.  She shares she had a mammogram two weeks ago and the results were normal.  She is agreeable to continue in follow up.  I thanked patient for her time. Suzanne Carney Oncology Research Assistant 10/09/2017 1:12 PM

## 2018-10-20 ENCOUNTER — Telehealth: Payer: Self-pay | Admitting: Oncology

## 2018-10-20 NOTE — Telephone Encounter (Signed)
Called patient on the phone today for the follow-up visit to the B-42 study.  The patient states she is doing good.  She is not having any problems.  The patient states she is still alright with following her for the study and she can be reached at [phone].  The patient was thanked for her continued support in this study. Remer Macho 10/20/2018 11:30 am

## 2019-11-09 ENCOUNTER — Telehealth: Payer: Self-pay | Admitting: Oncology

## 2019-11-09 NOTE — Telephone Encounter (Signed)
11/06/19 - Called patient on the phone for the NSABP B-42 study that the patient is enrolled to.  The patient stated that she had not been in the hospital in the last 12 months.  She had not had any broken bones or fractures and had not had a bone density test in the last 12 months.  She only takes calcium and no other meds for her bones.  She has not had a cardiac or thrombotic event in the last 12 months.  I will call the patient again in a year.  I thanked her for her continued support in this clinical trial. Remer Macho 11/09/2019 2:56 pm

## 2020-11-16 ENCOUNTER — Telehealth: Payer: Self-pay | Admitting: Oncology

## 2020-11-16 NOTE — Telephone Encounter (Signed)
11/16/20 - NSABP B-42 follow-up phone visit. Called and spoke to patient on the phone for her yearly follow-up visit for the B-42 study.  She states that she is doing good.  She has not been in the hospital in the last year.  She has not had any fractures or broken bones.  She has not had any cardiac or thrombotic events in the last year.  She asked if we had received any results from the study yet and we have not.  I told her once we did that I would let her know what she was taking.  I thanked the patient for her continued support in this clinical trial.  I told her I would call her again next year. Remer Macho 11/16/20 12:40 pm

## 2022-11-25 ENCOUNTER — Telehealth: Payer: Self-pay | Admitting: Oncology

## 2022-11-25 NOTE — Telephone Encounter (Signed)
11/25/22 - NSABP B-42 study follow-up visit.  Called and spoke to the patient over the phone.  She states she is doing good.  No problems. I thanked her for her continued support in this clinical trial.  I told her she would be called again next year. Adella Nissen 11/25/22 - 11:28 am

## 2023-11-24 ENCOUNTER — Telehealth: Payer: Self-pay | Admitting: Pharmacy Technician

## 2023-11-24 NOTE — Telephone Encounter (Signed)
 I was able to speak with patient and get annual questions answered that will be entered in the NSABP portal for the B-42 Study. Patient stated no recurrence or progression of breast cancer. She is not taking any medications for bone density. Patient reports no cardiac events in the past year. She has not have any fractures to the wrist, hip or spine. Patient was thanked for her continued participation in the study.    Crystal Lonni Robert BS [facility]  Clinical Research Specialist II Oncology Services  Direct Dial: [phone] Fax: [phone]   11/24/2023 1:52 PM
